# Patient Record
Sex: Male | Born: 1960 | Hispanic: No | Marital: Married | Smoking: Former smoker
Health system: Southern US, Community
[De-identification: ages and names within clinical notes are randomized; demographics above are authoritative.]

## PROBLEM LIST (undated history)

## (undated) DIAGNOSIS — C679 Malignant neoplasm of bladder, unspecified: Secondary | ICD-10-CM

## (undated) HISTORY — PX: BLADDER SURGERY: SHX569

---

## 1999-09-07 ENCOUNTER — Emergency Department (HOSPITAL_COMMUNITY): Admission: EM | Admit: 1999-09-07 | Discharge: 1999-09-07 | Payer: Self-pay | Admitting: Emergency Medicine

## 2004-02-06 ENCOUNTER — Emergency Department (HOSPITAL_COMMUNITY): Admission: EM | Admit: 2004-02-06 | Discharge: 2004-02-06 | Payer: Self-pay | Admitting: *Deleted

## 2009-12-13 ENCOUNTER — Emergency Department (HOSPITAL_COMMUNITY): Admission: EM | Admit: 2009-12-13 | Discharge: 2009-12-13 | Payer: Self-pay | Admitting: Emergency Medicine

## 2010-12-18 LAB — DIFFERENTIAL
Basophils Absolute: 0 10*3/uL (ref 0.0–0.1)
Eosinophils Absolute: 0.1 10*3/uL (ref 0.0–0.7)
Lymphocytes Relative: 27 % (ref 12–46)
Lymphs Abs: 2.1 10*3/uL (ref 0.7–4.0)
Monocytes Absolute: 0.6 10*3/uL (ref 0.1–1.0)
Neutro Abs: 5 10*3/uL (ref 1.7–7.7)

## 2010-12-18 LAB — POCT I-STAT, CHEM 8
BUN: 17 mg/dL (ref 6–23)
Calcium, Ion: 1.07 mmol/L — ABNORMAL LOW (ref 1.12–1.32)
Chloride: 108 mEq/L (ref 96–112)
Creatinine, Ser: 1.1 mg/dL (ref 0.4–1.5)
Glucose, Bld: 119 mg/dL — ABNORMAL HIGH (ref 70–99)
Hemoglobin: 17.7 g/dL — ABNORMAL HIGH (ref 13.0–17.0)
Potassium: 4.2 mEq/L (ref 3.5–5.1)
Sodium: 140 mEq/L (ref 135–145)
TCO2: 28 mmol/L (ref 0–100)

## 2010-12-18 LAB — POCT CARDIAC MARKERS

## 2010-12-18 LAB — CBC
HCT: 49.9 % (ref 39.0–52.0)
Hemoglobin: 17 g/dL (ref 13.0–17.0)
MCV: 85.2 fL (ref 78.0–100.0)
RDW: 13 % (ref 11.5–15.5)

## 2011-05-26 ENCOUNTER — Emergency Department (HOSPITAL_COMMUNITY)
Admission: EM | Admit: 2011-05-26 | Discharge: 2011-05-26 | Discharge status: Against Medical Advice | Payer: Self-pay | Attending: Emergency Medicine | Admitting: Emergency Medicine

## 2011-05-26 DIAGNOSIS — R3989 Other symptoms and signs involving the genitourinary system: Secondary | ICD-10-CM | POA: Insufficient documentation

## 2011-06-27 ENCOUNTER — Inpatient Hospital Stay (INDEPENDENT_AMBULATORY_CARE_PROVIDER_SITE_OTHER)
Admission: RE | Admit: 2011-06-27 | Discharge: 2011-06-27 | Disposition: A | Discharge status: Home / Self Care | Payer: Self-pay | Source: Ambulatory Visit | Attending: Emergency Medicine | Admitting: Emergency Medicine

## 2011-06-27 DIAGNOSIS — K120 Recurrent oral aphthae: Secondary | ICD-10-CM

## 2011-07-31 ENCOUNTER — Other Ambulatory Visit: Payer: Self-pay | Admitting: Internal Medicine

## 2011-08-02 ENCOUNTER — Ambulatory Visit
Admission: RE | Admit: 2011-08-02 | Discharge: 2011-08-02 | Disposition: A | Discharge status: Home / Self Care | Payer: Self-pay | Source: Ambulatory Visit | Attending: Internal Medicine | Admitting: Internal Medicine

## 2011-09-06 NOTE — Patient Instructions (Signed)
20 [name]  09/06/2011   Your procedure is scheduled on:  09/13/2011  Report to [facility] at  700  AM.  Call this number if you have problems the morning of surgery: [phone]   Remember:   Do not eat food:After Midnight.  May have clear liquids:until Midnight .  Clear liquids include soda, tea, black coffee, apple or grape juice, broth.  Take these medicines the morning of surgery with A SIP OF WATER: none   Do not wear jewelry, make-up or nail polish.  Do not wear lotions, powders, or perfumes. You may wear deodorant.  Do not shave 48 hours prior to surgery.  Do not bring valuables to the hospital.  Contacts, dentures or bridgework may not be worn into surgery.  Leave suitcase in the car. After surgery it may be brought to your room.  For patients admitted to the hospital, checkout time is 11:00 AM the day of discharge.   Patients discharged the day of surgery will not be allowed to drive home.  Name and phone number of your driver: family  Special Instructions: CHG Shower Use Special Wash: 1/2 bottle night before surgery and 1/2 bottle morning of surgery.   Please read over the following fact sheets that you were given: Pain Booklet, MRSA Information, Surgical Site Infection Prevention, Anesthesia Post-op Instructions and Care and Recovery After Surgery Transurethral Resection, Bladder Tumor A cancerous growth (tumor) can develop on the inside wall of the bladder. The bladder is the organ that holds urine. One way to remove the tumor is a procedure called a transurethral resection. The tumor is removed (resected) through the tube that carries urine from the bladder out of the body (urethra). No cuts (incisions) are made in the skin. Instead, the procedure is done through a thin telescope, called a resectoscope. Attached to it is a light and usually a tiny camera. The resectoscope is put into the urethra. In men, the urethra opens at the end of the penis. In women, it opens just  above the vagina.  A transurethral resection is usually used to remove tumors that have not gotten too big or too deep. These are called Stage 0, Stage 1 or Stage 2 bladder cancers. LET YOUR CAREGIVER KNOW ABOUT:  On the day of the procedure, your caregivers will need to know the last time you had anything to eat or drink. This includes water, gum, and candy. In advance, make sure they know about:   Any allergies.   All medications you are taking, including:   Herbs, eyedrops, over-the-counter medications and creams.   Blood thinners (anticoagulants), aspirin or other drugs that could affect blood clotting.   Use of steroids (by mouth or as creams).   Previous problems with anesthetics, including local anesthetics.   Possibility of pregnancy, if this applies.   Any history of blood clots.   Any history of bleeding or other blood problems.   Previous surgery.   Smoking history.   Any recent symptoms of colds or infections.   Other health problems.  RISKS AND COMPLICATIONS This is usually a safe procedure. Every procedure has risks, though. For a transurethral resection, they include:  Infection. Antibiotic medication would need to be taken.   Bleeding.   Light bleeding may last for several days after the procedure.   If bleeding continues or is heavy, the bladder may need rinsing. Or, a new catheter might be put in for awhile.   Sometimes bed rest is needed.  Urination problems.   Pain and burning can occur when urinating. This usually goes away in a few days.   Scarring from the procedure can block the flow of urine.   Bladder damage.   It can be punctured or torn during removal of the tumor. If this happens, a catheter might be needed for longer. Antibiotics would be taken while the bladder heals.   Urine can leak through the hole or tear into the abdomen. If this happens, surgery may be needed to repair the bladder.  BEFORE THE PROCEDURE   A medical  evaluation will be done. This may include:   A physical examination.   Urine test. This is to make sure you do not have a urinary tract infection.   Blood tests.   A test that checks the heart's rhythm (electrocardiogram).   Talking with an anesthesiologist. This is the person who will be in charge of the medication (anesthesia) to keep you from feeling pain during the transurethral resection. You might be asleep during the procedure (general anesthesia) or numb from the waist down, but awake during the procedure (spinal anesthesia). Ask your surgeon what to expect.   The person who is having a transurethral resection needs to give what is called informed consent. This requires signing a legal paper that gives permission for the procedure. To give informed consent:   You must understand how the procedure is done and why.   You must be told all the risks and benefits of the procedure.   You must sign the consent. Sometimes a legal guardian can do this.   Signing should be witnessed by a healthcare professional.   The day before the surgery, eat only a light dinner. Then, do not eat or drink anything for at least 8 hours before the surgery. Ask your caregiver if it is OK to take any needed medicines with a sip of water.   Arrive at least an hour before the surgery or whenever your surgeon recommends. This will give you time to check in and fill out any needed paperwork.  PROCEDURE  The preparation:   You will change into a hospital gown.   A needle will be inserted in your arm. This is an intravenous access tube (IV). Medication will be able to flow directly into your body through this needle.   Small monitors will be put on your body. They are used to check your heart, blood pressure, and oxygen level.   You might be given medication that will help you relax (sedative).   You will be given a general anesthetic or spinal anesthesia.   The procedure:   Once you are asleep or  numb from the waist down, your legs will be placed in stirrups.   The resectoscope will be passed through the urethra into the bladder.   Fluid will be passed through the resectoscope. This will fill the bladder with water.   The surgeon will examine the bladder through the scope. If the scope has a camera, it can take pictures from inside the bladder. They can be projected onto a TV screen.   The surgeon will use various tools to remove the tumor in small pieces. Sometimes a laser (a beam of light energy) is used. Other tools may use electric current.   A tube (catheter) will often be placed so that urine can drain into a bag outside the body. This process helps stop bleeding. This tube keeps blood clots from blocking the urethra.  The procedure usually takes 30 to 45 minutes.  AFTER THE PROCEDURE   You will stay in a recovery area until the anesthesia has worn off. Your blood pressure and pulse will be checked every so often. Then you will be taken to a hospital room.   You may continue to get fluids through the IV for awhile.   Some pain is normal. The catheter might be uncomfortable. Pain is usually not severe. If it is, ask for pain medicine.   Your urine may look bloody after a transurethral resection. This is normal.   If bleeding is heavy, a hospital caregiver may rinse out the bladder (irrigation) through the catheter.   Once the urine is clear, the catheter will be taken out.   You will need to stay in the hospital until you can urinate on your own.   Most people stay in the hospital for up to 4 days.  PROGNOSIS   Transurethral resection is considered the best way to treat bladder tumors that are not too far along. For most people, the treatment is successful. Sometimes, though, more treatment is needed.   Bladder cancers can come back even after a successful procedure. Because of this, be sure to have a checkup with your caregiver every 3 to 6 months. If everything is OK  for 3 years, you can reduce the checkups to once a year.  Document Released: 07/07/2009 Document Revised: 05/23/2011 Document Reviewed: 07/07/2009 [facility] Patient Information 2012 [place], [place].PATIENT INSTRUCTIONS POST-ANESTHESIA  IMMEDIATELY FOLLOWING SURGERY:  Do not drive or operate machinery for the first twenty four hours after surgery.  Do not make any important decisions for twenty four hours after surgery or while taking narcotic pain medications or sedatives.  If you develop intractable nausea and vomiting or a severe headache please notify your doctor immediately.  FOLLOW-UP:  Please make an appointment with your surgeon as instructed. You do not need to follow up with anesthesia unless specifically instructed to do so.  WOUND CARE INSTRUCTIONS (if applicable):  Keep a dry clean dressing on the anesthesia/puncture wound site if there is drainage.  Once the wound has quit draining you may leave it open to air.  Generally you should leave the bandage intact for twenty four hours unless there is drainage.  If the epidural site drains for more than 36-48 hours please call the anesthesia department.  QUESTIONS?:  Please feel free to call your physician or the hospital operator if you have any questions, and they will be happy to assist you.     [facility] Anesthesia Department [address] [place] [phone]

## 2011-09-07 ENCOUNTER — Other Ambulatory Visit: Payer: Self-pay

## 2011-09-07 ENCOUNTER — Encounter (HOSPITAL_COMMUNITY)
Admission: RE | Admit: 2011-09-07 | Discharge: 2011-09-07 | Disposition: A | Discharge status: Home / Self Care | Payer: Medicaid Other | Source: Ambulatory Visit | Attending: Urology | Admitting: Urology

## 2011-09-07 ENCOUNTER — Encounter (HOSPITAL_COMMUNITY): Payer: Self-pay | Admitting: Pharmacy Technician

## 2011-09-07 ENCOUNTER — Encounter (HOSPITAL_COMMUNITY): Payer: Self-pay

## 2011-09-07 LAB — SURGICAL PCR SCREEN
MRSA, PCR: NEGATIVE
Staphylococcus aureus: NEGATIVE

## 2011-09-07 LAB — BASIC METABOLIC PANEL
CO2: 29 mEq/L (ref 19–32)
Chloride: 100 mEq/L (ref 96–112)
GFR calc Af Amer: >90 mL/min (ref >90)
Glucose, Bld: 97 mg/dL (ref 70–99)

## 2011-09-07 LAB — CBC
HCT: 47.6 % (ref 39.0–52.0)
Hemoglobin: 16.1 g/dL (ref 13.0–17.0)
MCH: 28.1 pg (ref 26.0–34.0)
MCHC: 33.8 g/dL (ref 30.0–36.0)
MCV: 83.1 fL (ref 78.0–100.0)
RBC: 5.73 MIL/uL (ref 4.22–5.81)
RDW: 13.3 % (ref 11.5–15.5)
WBC: 5.6 10*3/uL (ref 4.0–10.5)

## 2011-09-13 ENCOUNTER — Ambulatory Visit (HOSPITAL_COMMUNITY): Payer: Medicaid Other | Admitting: Anesthesiology

## 2011-09-13 ENCOUNTER — Ambulatory Visit (HOSPITAL_COMMUNITY)
Admission: RE | Admit: 2011-09-13 | Discharge: 2011-09-14 | Disposition: A | Discharge status: Home / Self Care | Payer: Medicaid Other | Source: Ambulatory Visit | Attending: Urology | Admitting: Urology

## 2011-09-13 ENCOUNTER — Other Ambulatory Visit: Payer: Self-pay | Admitting: Urology

## 2011-09-13 ENCOUNTER — Encounter (HOSPITAL_COMMUNITY): Payer: Self-pay | Admitting: Anesthesiology

## 2011-09-13 ENCOUNTER — Encounter (HOSPITAL_COMMUNITY): Payer: Self-pay | Admitting: *Deleted

## 2011-09-13 ENCOUNTER — Encounter (HOSPITAL_COMMUNITY)
Admission: RE | Disposition: A | Discharge status: Home / Self Care | Payer: Self-pay | Source: Ambulatory Visit | Attending: Urology

## 2011-09-13 DIAGNOSIS — C679 Malignant neoplasm of bladder, unspecified: Secondary | ICD-10-CM | POA: Insufficient documentation

## 2011-09-13 DIAGNOSIS — R310 Gross hematuria: Secondary | ICD-10-CM | POA: Insufficient documentation

## 2011-09-13 HISTORY — PX: TRANSURETHRAL RESECTION OF BLADDER TUMOR: SHX2575

## 2011-09-13 SURGERY — TURBT (TRANSURETHRAL RESECTION OF BLADDER TUMOR)
Anesthesia: Spinal | Wound class: Clean Contaminated

## 2011-09-13 MED ORDER — MIDAZOLAM HCL 2 MG/2ML IJ SOLN
INTRAMUSCULAR | Status: AC
  Filled 2011-09-13: qty 2

## 2011-09-13 MED ORDER — LACTATED RINGERS IV SOLN
INTRAVENOUS | Status: DC
  Administered 2011-09-13: 1000 mL via INTRAVENOUS
  Administered 2011-09-13: 10:00:00 via INTRAVENOUS

## 2011-09-13 MED ORDER — PROPOFOL 10 MG/ML IV EMUL
INTRAVENOUS | Status: AC
  Filled 2011-09-13: qty 20

## 2011-09-13 MED ORDER — FENTANYL CITRATE 0.05 MG/ML IJ SOLN
INTRAMUSCULAR | Status: AC
  Filled 2011-09-13: qty 2

## 2011-09-13 MED ORDER — HYDROMORPHONE HCL PF 1 MG/ML IJ SOLN
1.0000 mg | INTRAMUSCULAR | Status: DC | PRN
  Administered 2011-09-13 – 2011-09-14 (×2): 1 mg via INTRAVENOUS
  Filled 2011-09-13 (×2): qty 1

## 2011-09-13 MED ORDER — ONDANSETRON HCL 4 MG/2ML IJ SOLN: 4.0000 mg | Freq: Once | INTRAMUSCULAR | Status: DC | PRN

## 2011-09-13 MED ORDER — MIDAZOLAM HCL 2 MG/2ML IJ SOLN
1.0000 mg | INTRAMUSCULAR | Status: DC | PRN
  Administered 2011-09-13 (×2): 2 mg via INTRAVENOUS

## 2011-09-13 MED ORDER — BUPIVACAINE HCL 0.75 % IJ SOLN
INTRAMUSCULAR | Status: DC | PRN
  Administered 2011-09-13: 13.5 mg via INTRATHECAL

## 2011-09-13 MED ORDER — MIDAZOLAM HCL 2 MG/2ML IJ SOLN
INTRAMUSCULAR | Status: AC
Start: 2011-09-13 — End: 2011-09-13
  Filled 2011-09-13: qty 2

## 2011-09-13 MED ORDER — FENTANYL CITRATE 0.05 MG/ML IJ SOLN: 25.0000 ug | INTRAMUSCULAR | Status: DC | PRN

## 2011-09-13 MED ORDER — PHENYLEPHRINE HCL 10 MG/ML IJ SOLN
INTRAMUSCULAR | Status: DC | PRN
  Administered 2011-09-13: 50 ug via INTRAVENOUS

## 2011-09-13 MED ORDER — MIDAZOLAM HCL 2 MG/2ML IJ SOLN
INTRAMUSCULAR | Status: AC
  Administered 2011-09-13: 2 mg via INTRAVENOUS
  Filled 2011-09-13: qty 2

## 2011-09-13 MED ORDER — GLYCINE 1.5 % IR SOLN
Status: DC | PRN
  Administered 2011-09-13: 3000 mL

## 2011-09-13 MED ORDER — PROPOFOL 10 MG/ML IV EMUL
INTRAVENOUS | Status: DC | PRN
  Administered 2011-09-13: 35 ug/kg/min via INTRAVENOUS

## 2011-09-13 MED ORDER — STERILE WATER FOR IRRIGATION IR SOLN
Status: DC | PRN
  Administered 2011-09-13 (×2): 3000 mL
  Administered 2011-09-13: 1000 mL
  Administered 2011-09-13: 3000 mL

## 2011-09-13 MED ORDER — MITOMYCIN CHEMO FOR BLADDER INSTILLATION 40 MG
60.0000 mg | Freq: Once | INTRAVENOUS | Status: DC
  Administered 2011-09-13: 60 mg via INTRAVESICAL
  Filled 2011-09-13: qty 60

## 2011-09-13 MED ORDER — BUPIVACAINE IN DEXTROSE 0.75-8.25 % IT SOLN
INTRATHECAL | Status: AC
  Filled 2011-09-13: qty 2

## 2011-09-13 MED ORDER — PHENYLEPHRINE HCL 10 MG/ML IJ SOLN
INTRAMUSCULAR | Status: AC
  Filled 2011-09-13: qty 1

## 2011-09-13 MED ORDER — MIDAZOLAM HCL 5 MG/5ML IJ SOLN
INTRAMUSCULAR | Status: DC | PRN
  Administered 2011-09-13: 2 mg via INTRAVENOUS

## 2011-09-13 MED ORDER — FENTANYL CITRATE 0.05 MG/ML IJ SOLN
INTRAMUSCULAR | Status: DC | PRN
  Administered 2011-09-13: 20 ug via INTRATHECAL
  Administered 2011-09-13: 50 ug via INTRAVENOUS

## 2011-09-13 SURGICAL SUPPLY — 31 items
BAG DECANTER FOR FLEXI CONT (MISCELLANEOUS) ×2 IMPLANT
BAG DRAIN URO TABLE W/ADPT NS (DRAPE) ×2 IMPLANT
BAG DRN 8 ADPR NS SKTRN CSTL (DRAPE) ×1
BAG URINE DRAINAGE (UROLOGICAL SUPPLIES) ×2 IMPLANT
CABLE HI FREQUENCY MONOPOLAR (ELECTROSURGICAL) ×2 IMPLANT
CATH FOLEY 2WAY SLVR  5CC 20FR (CATHETERS) ×1
CATH FOLEY 2WAY SLVR  5CC 22FR (CATHETERS) ×1
CATH FOLEY 2WAY SLVR 5CC 20FR (CATHETERS) ×1 IMPLANT
CATH FOLEY 2WAY SLVR 5CC 22FR (CATHETERS) IMPLANT
CLOTH BEACON ORANGE TIMEOUT ST (SAFETY) ×2 IMPLANT
CONNECTOR 5 IN 1 STRAIGHT STRL (MISCELLANEOUS) ×2 IMPLANT
ELECT CUT LOOP C-MAX 27FR .012 (CUTTING LOOP) ×2
ELECTRODE CUT LP CMX 27FR .012 (CUTTING LOOP) ×1 IMPLANT
FORMALIN 10 PREFIL 120ML (MISCELLANEOUS) IMPLANT
GLOVE BIO SURGEON STRL SZ7 (GLOVE) ×2 IMPLANT
GLOVE BIOGEL PI IND STRL 7.0 (GLOVE) IMPLANT
GLOVE BIOGEL PI INDICATOR 7.0 (GLOVE) ×2
GLOVE EXAM NITRILE MD LF STRL (GLOVE) ×1 IMPLANT
GLOVE OPTIFIT SS 6.5 STRL BRWN (GLOVE) ×1 IMPLANT
GLYCINE 1.5% IRRIG UROMATIC (IV SOLUTION) ×7 IMPLANT
GOWN STRL REIN XL XLG (GOWN DISPOSABLE) ×2 IMPLANT
IV NS IRRIG 3000ML ARTHROMATIC (IV SOLUTION) ×2 IMPLANT
KIT ROOM TURNOVER AP CYSTO (KITS) ×2 IMPLANT
MANIFOLD NEPTUNE II (INSTRUMENTS) ×2 IMPLANT
PACK CYSTO (CUSTOM PROCEDURE TRAY) ×2 IMPLANT
PAD ARMBOARD 7.5X6 YLW CONV (MISCELLANEOUS) ×2 IMPLANT
PAD TELFA 3X4 1S STER (GAUZE/BANDAGES/DRESSINGS) IMPLANT
SET IRRIGATING DISP (SET/KITS/TRAYS/PACK) ×2 IMPLANT
SYR 30ML LL (SYRINGE) ×2 IMPLANT
TOWEL OR 17X26 4PK STRL BLUE (TOWEL DISPOSABLE) ×2 IMPLANT
YANKAUER SUCT BULB TIP 10FT TU (MISCELLANEOUS) ×2 IMPLANT

## 2011-09-13 NOTE — Progress Notes (Signed)
Pt turned to back. Foley reconnected to drainage bag.

## 2011-09-13 NOTE — Anesthesia Procedure Notes (Signed)
Spinal  Patient location during procedure: OR Start time: 09/13/2011 9:11 AM Staffing CRNA/Resident: Glynn Octave Preanesthetic Checklist Completed: patient identified, site marked, surgical consent, pre-op evaluation, timeout performed, IV checked, risks and benefits discussed and monitors and equipment checked Spinal Block Patient position: sitting Prep: DuraPrep Patient monitoring: heart rate, cardiac monitor, continuous pulse ox and blood pressure Approach: midline Location: L4-5 Injection technique: single-shot Needle Needle type: Spinocan  Needle gauge: 22 G Needle length: 9 cm Assessment Sensory level: T8 Additional Notes Tray lot # [id number], Exp date 05/2012

## 2011-09-13 NOTE — Progress Notes (Signed)
Pt turned to right side.

## 2011-09-13 NOTE — Transfer of Care (Signed)
Immediate Anesthesia Transfer of Care Note  Patient: [name]  Procedure(s) Performed:  TRANSURETHRAL RESECTION OF BLADDER TUMOR (TURBT) - large bladder tumor  Patient Location: PACU  Anesthesia Type: Spinal  Level of Consciousness: awake, alert  and oriented  Airway & Oxygen Therapy: Patient Spontanous Breathing and Patient connected to nasal cannula oxygen  Post-op Assessment: Report given to PACU RN  Post vital signs: Reviewed  Complications: No apparent anesthesia complications

## 2011-09-13 NOTE — Brief Op Note (Signed)
09/13/2011  9:42 AM  PATIENT:  [name]  50 y.o. male  PRE-OPERATIVE DIAGNOSIS:  bladder cancer  POST-OPERATIVE DIAGNOSIS:  bladder cancer  PROCEDURE:  Procedure(s): TRANSURETHRAL RESECTION OF BLADDER TUMOR (TURBT)  SURGEON:  Surgeon(s): [name] Barban  PHYSICIAN ASSISTANT:   ASSISTANTS: none   ANESTHESIA:   general  EBL:  Total I/O In: 500 [I.V.:500] Out: 0   BLOOD ADMINISTERED:none  DRAINS: Urinary Catheter (Foley)   LOCAL MEDICATIONS USED:  NONE  SPECIMEN:  Scraping  DISPOSITION OF SPECIMEN:  PATHOLOGY  COUNTS:  YES  TOURNIQUET:  * No tourniquets in log *  DICTATION: .Other Dictation: Dictation Number [id number]  PLAN OF CARE: Admit for overnight observation  PATIENT DISPOSITION:  PACU - hemodynamically stable.   Delay start of Pharmacological VTE agent (>24hrs) due to surgical blood loss or risk of bleeding:  {YES

## 2011-09-13 NOTE — Progress Notes (Signed)
Turned pt to back.

## 2011-09-13 NOTE — Progress Notes (Addendum)
Turned pt to left side after mitomycin instillation. Foley clamped.

## 2011-09-13 NOTE — Plan of Care (Signed)
Problem: Phase I Progression Outcomes Goal: Initial discharge plan identified Outcome: Completed/Met Date Met:  09/13/11 Home with family

## 2011-09-13 NOTE — Anesthesia Preprocedure Evaluation (Addendum)
Anesthesia Evaluation  Patient identified by MRN, date of birth, ID band Patient awake    Reviewed: Allergy & Precautions, H&P , NPO status , Patient's Chart, lab work & pertinent test results  Airway Mallampati: I      Dental  (+) Teeth Intact   Pulmonary neg pulmonary ROS,  clear to auscultation        Cardiovascular neg cardio ROS Regular Normal    Neuro/Psych    GI/Hepatic   Endo/Other    Renal/GU      Musculoskeletal   Abdominal   Peds  Hematology   Anesthesia Other Findings   Reproductive/Obstetrics                           Anesthesia Physical Anesthesia Plan  ASA: II  Anesthesia Plan: Spinal   Post-op Pain Management:    Induction:   Airway Management Planned: Nasal Cannula  Additional Equipment:   Intra-op Plan:   Post-operative Plan:   Informed Consent: I have reviewed the patients History and Physical, chart, labs and discussed the procedure including the risks, benefits and alternatives for the proposed anesthesia with the patient or authorized representative who has indicated his/her understanding and acceptance.     Plan Discussed with:   Anesthesia Plan Comments:         Anesthesia Quick Evaluation

## 2011-09-13 NOTE — Anesthesia Postprocedure Evaluation (Addendum)
  Anesthesia Post-op Note  Patient: [name]  Procedure(s) Performed:  TRANSURETHRAL RESECTION OF BLADDER TUMOR (TURBT) - large bladder tumor  Patient Location: PACU  Anesthesia Type: Spinal  Level of Consciousness: awake, alert  and oriented  Airway and Oxygen Therapy: Patient Spontanous Breathing  Post-op Pain: none  Post-op Assessment: Post-op Vital signs reviewed, Patient's Cardiovascular Status Stable, Respiratory Function Stable and No signs of Nausea or vomiting  Post-op Vital Signs: Reviewed, SBP 74, neosynephrine 50 mcg IV  Complications: No apparent anesthesia complications   09/14/11  Patient doing well.  VSS.  Sensation returned to normal, denies headache and/or backache.  No apparent anesthesia complications.

## 2011-09-13 NOTE — Progress Notes (Signed)
Pt reexaminedand no change in h&p. 

## 2011-09-13 NOTE — H&P (Signed)
[name], [name]                ACCOUNT NO.:  [id number]  MEDICAL RECORD NO.:  [id number]  LOCATION:                                 FACILITY:  PHYSICIAN:  Ky Barban, M.D.DATE OF BIRTH:  June 09, 1961  DATE OF ADMISSION:  09/13/2011 DATE OF DISCHARGE:  LH                             HISTORY & PHYSICAL   CHIEF COMPLAINT:  Gross total painless hematuria.  HISTORY OF PRESENT ILLNESS:  This is a 49 year old gentleman who for the last couple of years is having gross total painless hematuria, has no voiding difficulty.  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  So, he is being brought as outpatient to undergo TUR bladder tumor under anesthesia and I will see if I can keep him overnight in the hospital.  He has also had a renal ultrasound done and that in the bladder they can see a irregular right posterior bladder wall lesion protrudes into the distended bladder.  Suspicious for tumor, so I confirmed with cystoscopy that it is a tumor.  PAST MEDICAL HISTORY:  No history of diabetes or hypertension.  PERSONAL HISTORY:  He used to smoke, quit smoking.  He used to smoke for 20 years, 1-pack per day.  Drinking, no.  REVIEW OF SYSTEMS:  Unremarkable.  PHYSICAL EXAMINATION:  VITAL SIGNS:  Blood pressure 120/80, temperature is normal. CENTRAL NERVOUS SYSTEM:  No gross neurological deficit. HEAD, NECK, EYE, ENT:  Negative. CHEST:  Symmetrical. HEART:  Regular sinus rhythm. ABDOMEN:  Soft and flat.  Liver, spleen, and kidneys are not palpable. No CVA tenderness. EXTERNAL GENITALIA:  Circumcised meatus is adequate.  Testicles are normal. RECTAL:  Deferred. EXTREMITIES:  Normal.  IMPRESSION:  Bladder tumor.  PLAN:  TUR bladder tumor under anesthesia, then keep him overnight in the hospital.     Ky Barban, M.D.     MIJ/MEDQ  D:  09/12/2011  T:  09/12/2011  Job:   [id number]  cc:   Beverely Risen, MD Fax: [phone]

## 2011-09-14 LAB — CBC
MCH: 27.6 pg (ref 26.0–34.0)
MCV: 86.1 fL (ref 78.0–100.0)
Platelets: 121 10*3/uL — ABNORMAL LOW (ref 150–400)
RDW: 13.8 % (ref 11.5–15.5)

## 2011-09-14 LAB — BASIC METABOLIC PANEL
BUN: 9 mg/dL (ref 6–23)
CO2: 31 mEq/L (ref 19–32)
Calcium: 9.4 mg/dL (ref 8.4–10.5)
Creatinine, Ser: 0.89 mg/dL (ref 0.50–1.35)
Glucose, Bld: 98 mg/dL (ref 70–99)

## 2011-09-14 MED ORDER — TOLTERODINE TARTRATE ER 4 MG PO CP24: 4.0000 mg | ORAL_CAPSULE | Freq: Every day | ORAL | Status: DC

## 2011-09-14 MED ORDER — MAGNESIUM HYDROXIDE 400 MG/5ML PO SUSP
30.0000 mL | Freq: Once | ORAL | Status: AC
  Administered 2011-09-14: 30 mL via ORAL
  Filled 2011-09-14: qty 30

## 2011-09-14 MED ORDER — TOLTERODINE TARTRATE 2 MG PO TABS
4.0000 mg | ORAL_TABLET | Freq: Once | ORAL | Status: AC
  Administered 2011-09-14: 4 mg via ORAL
  Filled 2011-09-14: qty 2

## 2011-09-14 MED ORDER — OXYCODONE-ACETAMINOPHEN 5-325 MG PO TABS: 1.0000 | ORAL_TABLET | Freq: Four times a day (QID) | ORAL | Status: AC | PRN

## 2011-09-14 MED ORDER — OXYCODONE-ACETAMINOPHEN 5-325 MG PO TABS
1.0000 | ORAL_TABLET | ORAL | Status: DC | PRN
  Administered 2011-09-14: 1 via ORAL
  Filled 2011-09-14: qty 1

## 2011-09-14 NOTE — Addendum Note (Signed)
Addendum  created 09/14/11 1610 by Glynn Octave   Modules edited:Notes Section

## 2011-09-14 NOTE — Progress Notes (Signed)
UR Chart Review Completed  

## 2011-09-14 NOTE — Op Note (Signed)
NAMEBRINDEN, KINCHELOE                ACCOUNT NO.:  [id number]  MEDICAL RECORD NO.:  [id number]  LOCATION:  A321                          FACILITY:  [facility]  PHYSICIAN:  Ky Barban, M.D.DATE OF BIRTH:  October 14, 1960  DATE OF PROCEDURE:  09/13/2011 DATE OF DISCHARGE:                              OPERATIVE REPORT   PREOPERATIVE DIAGNOSIS:  Bladder tumor.  POSTOPERATIVE DIAGNOSIS:  Bladder tumor.  PROCEDURE:  TUR, bladder tumor.  ANESTHESIA:  General.  PROCEDURE:  The patient under general endotracheal anesthesia in lithotomy position, after usual prep and drape, the Iglesias resectoscope was introduced into the bladder.  The tumor which is located on the right bladder wall, just a little bit posterior to the orifice is a papillary tumor and resection of the tumor from the surface was started and gradually went down toward the base of the tumor.  Tumor was completely resected off the bladder wall and the orifice was protected, not resected.  After resecting the tumor completely, the chips were evacuated.  Then, I went back and fulgurated the base of the tumor and the rest of the bladder was already examined.  There is no other focus of the tumor.  After making sure all the chips were out of the bladder, resectoscope was removed.  A 22 Foley catheter was left in for drainage.  There is no bleeding going on.  The patient left the operating room in satisfactory condition.     Ky Barban, M.D.     MIJ/MEDQ  D:  09/13/2011  T:  09/14/2011  Job:  [id number]  cc:   Beverely Risen, MD Fax: [phone]

## 2011-09-14 NOTE — Discharge Summary (Signed)
Report#[id number] 

## 2011-09-14 NOTE — Progress Notes (Signed)
PIV removed without complaint, patient discharged home. Patient verbalizes understanding of discharge instructions, prescriptions and follow up appointments. Patient escorted out by staff, transported by family. 

## 2011-09-15 NOTE — Consult Note (Signed)
[name], SPIVAK NO.:  [id number]  MEDICAL RECORD NO.:  [id number]  LOCATION:  A321                          FACILITY:  [facility]  PHYSICIAN:  Ky Barban, M.D.DATE OF BIRTH:  [birth year]  DATE OF CONSULTATION: DATE OF DISCHARGE:  09/14/2011                                DISCHARGE SUMMARY This gentleman who is 50 year old was having recurrent gross hematuria for several months with cystoscope in the office and was found to have papillary tumor in the bladder.  He was brought yesterday and under general anesthesia a TUR of bladder tumor was done.  Postop course was benign.  He remained afebrile.  Today, his urine is absolutely clear. His lab workup was as follows:  Sodium 138, potassium 4.2, chloride 102, CO2 is 31.  His CBC shows WBC count is 5.6, hematocrit is 47.6 and his urine is clear, so I am going to discharge him home.  He is up and walking around today and looks like he is having some bladder spasms, so I am going to go ahead and give him milk of magnesia, so he can have bowel movement and also give him of Detrol to treat his bladder spasms and he is going home with a Foley catheter.  I told him to go home, take it easy, on Monday he can take his Foley catheter out.  I showed him how to take it out and after that if he has significant bleeding to let me know and if he has any fever to let me know.  I am also going to give him Percocet 5 mg, #30 tablets, p.r.n. for pain and I am going to see him back in 2 weeks in the office.  His final pathology report is still pending.  I have told him that, but now he is being discharged.  FINAL DISCHARGE DIAGNOSIS:  Bladder tumor.  DISCHARGE CONDITION:  Improved.  DISCHARGE MEDICATION:  Percocet 5-325, #30, 1 q.6 h p.r.n.  He will get Detrol LA 4 mg 1 p.o. daily p.r.n., #10.  We will see him back in 2 weeks in the office.     Ky Barban, M.D.     MIJ/MEDQ  D:  09/14/2011  T:  09/15/2011  Job:   [id number]

## 2011-09-18 ENCOUNTER — Encounter (HOSPITAL_COMMUNITY): Payer: Self-pay | Admitting: Emergency Medicine

## 2011-09-18 ENCOUNTER — Emergency Department (HOSPITAL_COMMUNITY)
Admission: EM | Admit: 2011-09-18 | Discharge: 2011-09-18 | Disposition: A | Discharge status: Home / Self Care | Payer: Self-pay | Attending: Emergency Medicine | Admitting: Emergency Medicine

## 2011-09-18 DIAGNOSIS — R319 Hematuria, unspecified: Secondary | ICD-10-CM | POA: Insufficient documentation

## 2011-09-18 DIAGNOSIS — Z9889 Other specified postprocedural states: Secondary | ICD-10-CM | POA: Insufficient documentation

## 2011-09-18 DIAGNOSIS — Z8551 Personal history of malignant neoplasm of bladder: Secondary | ICD-10-CM | POA: Insufficient documentation

## 2011-09-18 DIAGNOSIS — Z79899 Other long term (current) drug therapy: Secondary | ICD-10-CM | POA: Insufficient documentation

## 2011-09-18 HISTORY — DX: Malignant neoplasm of bladder, unspecified: C67.9

## 2011-09-18 LAB — URINALYSIS, ROUTINE W REFLEX MICROSCOPIC
Bilirubin Urine: NEGATIVE
Ketones, ur: NEGATIVE mg/dL
Nitrite: NEGATIVE
Urobilinogen, UA: 0.2 mg/dL (ref 0.0–1.0)

## 2011-09-18 NOTE — ED Notes (Signed)
Patient stated he had surgery on his bladder on Thursday and had a foley catheter inserted. Patient was discharged on Friday with instructions to remove catheter on Monday. Patient removed catheter yesterday. Stated this morning approximately 2 hours ago, had fullness in abdomen and blood in urine when voiding. Was told in discharge instructions to return to hospital if bleeding noted in urine.

## 2011-09-18 NOTE — ED Provider Notes (Signed)
This chart was scribed for Shelda Jakes, MD by Wallis Mart. The patient was seen in room APA03/APA03 and the patient's care was started at 7:22 AM.   CSN: [id number]  Arrival date & time 09/18/11  [id number]   First MD Initiated Contact with Patient 09/18/11 [phone]      Chief Complaint  Patient presents with  . Hematuria    (Consider location/radiation/quality/duration/timing/severity/associated sxs/prior treatment) Patient is a 50 y.o. male presenting with hematuria. The history is provided by the patient.  Hematuria This is a new problem. The current episode started today. The problem has been gradually improving since onset. He reports no clotting in his urine stream. He is experiencing no pain. He describes his urine color as yellow. Pertinent negatives include no dysuria or fever.    Pt seen at 7:22 AM [name] is a 50 y.o. male who presents to the Emergency Department complaining of sudden onset, mild hematuria that began this morning.  Pt had bladder surgery on Thursday and removed his catheter as instructed yesterday and had no blood in urine at the time. Pt woke up at 4 AM this morning and saw blood in urine.  Pt drank water and urinated again and blood was still in urine.  Otherwise, pt is able to urinate with no problems. Pt denies pain in abdomen, but feels "fullness" in lower abdomen that began today.  Pt denies fever, headache, cp, breathing difficulty, back pain, swelling in legs, rash, congestion, visual changes, lightheadedness.  Pt has h/o bladder cancer.  Surgeon: Alleen Borne Past Medical History  Diagnosis Date  . Bladder cancer     Past Surgical History  Procedure Date  . Bladder surgery     Family History  Problem Relation Age of Onset  . Anesthesia problems Neg Hx   . Hypotension Neg Hx   . Malignant hyperthermia Neg Hx   . Pseudochol deficiency Neg Hx     History  Substance Use Topics  . Smoking status: Former Smoker -- 1.0 packs/day  for 20 years    Types: Cigarettes    Quit date: 09/07/2007  . Smokeless tobacco: Not on file  . Alcohol Use: No      Review of Systems  Constitutional: Negative for fever and fatigue.  HENT: Negative for congestion.   Respiratory: Negative for shortness of breath.   Cardiovascular: Negative for chest pain and leg swelling.  Genitourinary: Positive for hematuria. Negative for dysuria.  Musculoskeletal: Negative for back pain.  Neurological: Negative for light-headedness and headaches.   10 Systems reviewed and are negative for acute change except as noted in the HPI.   Allergies  Review of patient's allergies indicates no known allergies.  Home Medications   Current Outpatient Rx  Name Route Sig Dispense Refill  . OXYCODONE-ACETAMINOPHEN 5-325 MG PO TABS Oral Take 1 tablet by mouth every 6 (six) hours as needed. 30 tablet 0  . TOLTERODINE TARTRATE 4 MG PO CP24 Oral Take 1 capsule (4 mg total) by mouth daily. 10 capsule 0    BP 129/87  Pulse 74  Temp(Src) 97.7 F (36.5 C) (Oral)  Resp 16  Ht 6\' 2"  (1.88 m)  Wt 250 lb (113.399 kg)  BMI 32.10 kg/m2  SpO2 98%  Physical Exam  Nursing note and vitals reviewed. Constitutional: He is oriented to person, place, and time. He appears well-developed and well-nourished. No distress.  HENT:  Head: Normocephalic and atraumatic.  Eyes: EOM are normal. Pupils are equal, round, and reactive to  light.  Neck: Normal range of motion. Neck supple.  Cardiovascular: Normal rate, regular rhythm and normal heart sounds.   Pulmonary/Chest: Effort normal and breath sounds normal. No respiratory distress.  Abdominal: Soft. Bowel sounds are normal. He exhibits no distension.  Musculoskeletal: Normal range of motion. He exhibits no edema.  Lymphadenopathy:    He has no cervical adenopathy.  Neurological: He is alert and oriented to person, place, and time. No sensory deficit.  Skin: Skin is warm and dry.  Psychiatric: He has a normal mood  and affect. His behavior is normal.    ED Course  Procedures (including critical care time)  DIAGNOSTIC STUDIES: Oxygen Saturation is 98% on room air, normal by my interpretation.    COORDINATION OF CARE:     Results for orders placed during the hospital encounter of 09/18/11  URINALYSIS, ROUTINE W REFLEX MICROSCOPIC      Component Value Range   Color, Urine YELLOW  YELLOW    APPearance CLEAR  CLEAR    Specific Gravity, Urine 1.020  1.005 - 1.030    pH 6.0  5.0 - 8.0    Glucose, UA NEGATIVE  NEGATIVE (mg/dL)   Hgb urine dipstick LARGE (*) NEGATIVE    Bilirubin Urine NEGATIVE  NEGATIVE    Ketones, ur NEGATIVE  NEGATIVE (mg/dL)   Protein, ur NEGATIVE  NEGATIVE (mg/dL)   Urobilinogen, UA 0.2  0.0 - 1.0 (mg/dL)   Nitrite NEGATIVE  NEGATIVE    Leukocytes, UA NEGATIVE  NEGATIVE   URINE MICROSCOPIC-ADD ON      Component Value Range   RBC / HPF 11-20  <3 (RBC/hpf)   No results found.     1. Hematuria       MDM   Patient now voiding fine and had gross hematuria earlier that resolved here in the emergency department. UA still with microscopic hematuria. Discharge patient to hydrate well with the water today return for persistent gross blood in the urine or inability to void. He has followup with urology already scheduled.     I personally performed the services described in this documentation, which was scribed in my presence. The recorded information has been reviewed and considered.     Shelda Jakes, MD 09/18/11 [phone]

## 2011-09-21 ENCOUNTER — Encounter (HOSPITAL_COMMUNITY): Payer: Self-pay | Admitting: Urology

## 2011-10-09 NOTE — Discharge Summary (Signed)
[phone]

## 2011-10-10 NOTE — Consult Note (Signed)
NAMETAEVYN, HAUSEN NO.:  [id number]  MEDICAL RECORD NO.:  [id number]  LOCATION:                                 FACILITY:  PHYSICIAN:  Ky Barban, M.D.DATE OF BIRTH:  [birth year]  DATE OF CONSULTATION: DATE OF DISCHARGE:                                CONSULTATION   Mr. [name] is 51 year old gentleman who was having gross hematuria on and off for several months.  He was cystoscoped in the office.  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  After doing routine preadmission workup which is normal, CBC, BMET is normal.  EKG, chest x-ray is normal.  He was taken to the operating room and TUR bladder tumor was done.  Tumor was completely resected, and he was given a treatment of mitomycin in the bladder, irrigation in the recovery room as per our protocol, and postoperatively he did fine, next day his urine is clear and I planned to send him home with Foley catheter.  He is up and walking around.  FINAL DISCHARGE DIAGNOSES:  Bladder tumor, pathology report at that time was still pending and I will discuss with him further treatment when I get the pathology report.  Final discharge diagnosis is transitional cell carcinoma bladder.  DISCHARGE CONDITION:  Improved.  DISCHARGE MEDICATIONS:  None.  Report to the office in 1 day.  I will take his catheter out.     Ky Barban, M.D.     MIJ/MEDQ  D:  10/09/2011  T:  10/10/2011  Job:  [id number]

## 2012-01-07 ENCOUNTER — Encounter (HOSPITAL_COMMUNITY): Payer: Self-pay

## 2012-01-07 ENCOUNTER — Encounter (HOSPITAL_COMMUNITY)
Admission: RE | Admit: 2012-01-07 | Discharge: 2012-01-07 | Disposition: A | Discharge status: Home / Self Care | Payer: Medicaid Other | Source: Ambulatory Visit | Attending: Urology | Admitting: Urology

## 2012-01-07 LAB — DIFFERENTIAL
Basophils Relative: 1 % (ref 0–1)
Eosinophils Absolute: 0.1 10*3/uL (ref 0.0–0.7)
Monocytes Absolute: 0.5 10*3/uL (ref 0.1–1.0)
Monocytes Relative: 7 % (ref 3–12)

## 2012-01-07 LAB — BASIC METABOLIC PANEL
BUN: 8 mg/dL (ref 6–23)
Chloride: 100 mEq/L (ref 96–112)
Creatinine, Ser: 0.9 mg/dL (ref 0.50–1.35)
GFR calc Af Amer: >90 mL/min (ref >90)
GFR calc non Af Amer: >90 mL/min (ref >90)

## 2012-01-07 LAB — CBC
MCH: 28.1 pg (ref 26.0–34.0)
MCV: 82.6 fL (ref 78.0–100.0)
Platelets: 168 10*3/uL (ref 150–400)
RBC: 5.69 MIL/uL (ref 4.22–5.81)

## 2012-01-07 NOTE — Patient Instructions (Signed)
20 [name]  01/07/2012   Your procedure is scheduled on:  THursday, 01/10/12  Report to [facility] at 0840 AM.  Call this number if you have problems the morning of surgery: [phone]   Remember:   Do not eat food:After Midnight.  May have clear liquids:until Midnight .  Clear liquids include soda, tea, black coffee, apple or grape juice, broth.  Take these medicines the morning of surgery with A SIP OF WATER: none   Do not wear jewelry, make-up or nail polish.  Do not wear lotions, powders, or perfumes. You may wear deodorant.  Do not shave 48 hours prior to surgery.  Do not bring valuables to the hospital.  Contacts, dentures or bridgework may not be worn into surgery.  Leave suitcase in the car. After surgery it may be brought to your room.  For patients admitted to the hospital, checkout time is 11:00 AM the day of discharge.   Patients discharged the day of surgery will not be allowed to drive home.  Name and phone number of your driver: driver  Special Instructions: CHG Shower Use Special Wash: 1/2 bottle night before surgery and 1/2 bottle morning of surgery.   Please read over the following fact sheets that you were given: Pain Booklet, Coughing and Deep Breathing, MRSA Information, Surgical Site Infection Prevention, Anesthesia Post-op Instructions and Care and Recovery After Surgery   PATIENT INSTRUCTIONS POST-ANESTHESIA  IMMEDIATELY FOLLOWING SURGERY:  Do not drive or operate machinery for the first twenty four hours after surgery.  Do not make any important decisions for twenty four hours after surgery or while taking narcotic pain medications or sedatives.  If you develop intractable nausea and vomiting or a severe headache please notify your doctor immediately.  FOLLOW-UP:  Please make an appointment with your surgeon as instructed. You do not need to follow up with anesthesia unless specifically instructed to do so.  WOUND CARE INSTRUCTIONS (if applicable):  Keep  a dry clean dressing on the anesthesia/puncture wound site if there is drainage.  Once the wound has quit draining you may leave it open to air.  Generally you should leave the bandage intact for twenty four hours unless there is drainage.  If the epidural site drains for more than 36-48 hours please call the anesthesia department.  QUESTIONS?:  Please feel free to call your physician or the hospital operator if you have any questions, and they will be happy to assist you.         Transurethral Resection, Bladder Tumor A cancerous growth (tumor) can develop on the inside wall of the bladder. The bladder is the organ that holds urine. One way to remove the tumor is a procedure called a transurethral resection. The tumor is removed (resected) through the tube that carries urine from the bladder out of the body (urethra). No cuts (incisions) are made in the skin. Instead, the procedure is done through a thin telescope, called a resectoscope. Attached to it is a light and usually a tiny camera. The resectoscope is put into the urethra. In men, the urethra opens at the end of the penis. In women, it opens just above the vagina.  A transurethral resection is usually used to remove tumors that have not gotten too big or too deep. These are called Stage 0, Stage 1 or Stage 2 bladder cancers. LET YOUR CAREGIVER KNOW ABOUT:  On the day of the procedure, your caregivers will need to know the last time you had anything  to eat or drink. This includes water, gum, and candy. In advance, make sure they know about:   Any allergies.   All medications you are taking, including:   Herbs, eyedrops, over-the-counter medications and creams.   Blood thinners (anticoagulants), aspirin or other drugs that could affect blood clotting.   Use of steroids (by mouth or as creams).   Previous problems with anesthetics, including local anesthetics.   Possibility of pregnancy, if this applies.   Any history of blood  clots.   Any history of bleeding or other blood problems.   Previous surgery.   Smoking history.   Any recent symptoms of colds or infections.   Other health problems.  RISKS AND COMPLICATIONS This is usually a safe procedure. Every procedure has risks, though. For a transurethral resection, they include:  Infection. Antibiotic medication would need to be taken.   Bleeding.   Light bleeding may last for several days after the procedure.   If bleeding continues or is heavy, the bladder may need rinsing. Or, a new catheter might be put in for awhile.   Sometimes bed rest is needed.   Urination problems.   Pain and burning can occur when urinating. This usually goes away in a few days.   Scarring from the procedure can block the flow of urine.   Bladder damage.   It can be punctured or torn during removal of the tumor. If this happens, a catheter might be needed for longer. Antibiotics would be taken while the bladder heals.   Urine can leak through the hole or tear into the abdomen. If this happens, surgery may be needed to repair the bladder.  BEFORE THE PROCEDURE   A medical evaluation will be done. This may include:   A physical examination.   Urine test. This is to make sure you do not have a urinary tract infection.   Blood tests.   A test that checks the heart's rhythm (electrocardiogram).   Talking with an anesthesiologist. This is the person who will be in charge of the medication (anesthesia) to keep you from feeling pain during the transurethral resection. You might be asleep during the procedure (general anesthesia) or numb from the waist down, but awake during the procedure (spinal anesthesia). Ask your surgeon what to expect.   The person who is having a transurethral resection needs to give what is called informed consent. This requires signing a legal paper that gives permission for the procedure. To give informed consent:   You must understand how the  procedure is done and why.   You must be told all the risks and benefits of the procedure.   You must sign the consent. Sometimes a legal guardian can do this.   Signing should be witnessed by a healthcare professional.   The day before the surgery, eat only a light dinner. Then, do not eat or drink anything for at least 8 hours before the surgery. Ask your caregiver if it is OK to take any needed medicines with a sip of water.   Arrive at least an hour before the surgery or whenever your surgeon recommends. This will give you time to check in and fill out any needed paperwork.  PROCEDURE  The preparation:   You will change into a hospital gown.   A needle will be inserted in your arm. This is an intravenous access tube (IV). Medication will be able to flow directly into your body through this needle.   Small monitors will  be put on your body. They are used to check your heart, blood pressure, and oxygen level.   You might be given medication that will help you relax (sedative).   You will be given a general anesthetic or spinal anesthesia.   The procedure:   Once you are asleep or numb from the waist down, your legs will be placed in stirrups.   The resectoscope will be passed through the urethra into the bladder.   Fluid will be passed through the resectoscope. This will fill the bladder with water.   The surgeon will examine the bladder through the scope. If the scope has a camera, it can take pictures from inside the bladder. They can be projected onto a TV screen.   The surgeon will use various tools to remove the tumor in small pieces. Sometimes a laser (a beam of light energy) is used. Other tools may use electric current.   A tube (catheter) will often be placed so that urine can drain into a bag outside the body. This process helps stop bleeding. This tube keeps blood clots from blocking the urethra.   The procedure usually takes 30 to 45 minutes.  AFTER THE PROCEDURE    You will stay in a recovery area until the anesthesia has worn off. Your blood pressure and pulse will be checked every so often. Then you will be taken to a hospital room.   You may continue to get fluids through the IV for awhile.   Some pain is normal. The catheter might be uncomfortable. Pain is usually not severe. If it is, ask for pain medicine.   Your urine may look bloody after a transurethral resection. This is normal.   If bleeding is heavy, a hospital caregiver may rinse out the bladder (irrigation) through the catheter.   Once the urine is clear, the catheter will be taken out.   You will need to stay in the hospital until you can urinate on your own.   Most people stay in the hospital for up to 4 days.  PROGNOSIS   Transurethral resection is considered the best way to treat bladder tumors that are not too far along. For most people, the treatment is successful. Sometimes, though, more treatment is needed.   Bladder cancers can come back even after a successful procedure. Because of this, be sure to have a checkup with your caregiver every 3 to 6 months. If everything is OK for 3 years, you can reduce the checkups to once a year.  Document Released: 07/07/2009 Document Revised: 08/30/2011 Document Reviewed: 07/07/2009 [facility] Patient Information 2012 [place], [place].

## 2012-01-10 ENCOUNTER — Ambulatory Visit (HOSPITAL_COMMUNITY): Payer: Medicaid Other | Admitting: Anesthesiology

## 2012-01-10 ENCOUNTER — Encounter (HOSPITAL_COMMUNITY): Payer: Self-pay | Admitting: Anesthesiology

## 2012-01-10 ENCOUNTER — Encounter (HOSPITAL_COMMUNITY): Payer: Self-pay | Admitting: *Deleted

## 2012-01-10 ENCOUNTER — Ambulatory Visit (HOSPITAL_COMMUNITY)
Admission: RE | Admit: 2012-01-10 | Discharge: 2012-01-11 | Disposition: A | Discharge status: Home / Self Care | Payer: Medicaid Other | Source: Ambulatory Visit | Attending: Urology | Admitting: Urology

## 2012-01-10 ENCOUNTER — Encounter (HOSPITAL_COMMUNITY)
Admission: RE | Disposition: A | Discharge status: Home / Self Care | Payer: Self-pay | Source: Ambulatory Visit | Attending: Urology

## 2012-01-10 DIAGNOSIS — D494 Neoplasm of unspecified behavior of bladder: Secondary | ICD-10-CM | POA: Insufficient documentation

## 2012-01-10 DIAGNOSIS — Z8551 Personal history of malignant neoplasm of bladder: Secondary | ICD-10-CM | POA: Insufficient documentation

## 2012-01-10 HISTORY — PX: TRANSURETHRAL RESECTION OF BLADDER TUMOR: SHX2575

## 2012-01-10 HISTORY — PX: CYSTOSCOPY: SHX5120

## 2012-01-10 SURGERY — CYSTOSCOPY
Anesthesia: General | Site: Bladder | Wound class: Clean Contaminated

## 2012-01-10 MED ORDER — LACTATED RINGERS IV SOLN
INTRAVENOUS | Status: DC | PRN
  Administered 2012-01-10 (×2): via INTRAVENOUS

## 2012-01-10 MED ORDER — EPHEDRINE SULFATE 50 MG/ML IJ SOLN
INTRAMUSCULAR | Status: DC | PRN
  Administered 2012-01-10 (×2): 5 mg via INTRAVENOUS

## 2012-01-10 MED ORDER — MITOMYCIN CHEMO FOR BLADDER INSTILLATION 40 MG
60.0000 mg | Freq: Once | INTRAVENOUS | Status: DC
  Administered 2012-01-10: 60 mg via INTRAVESICAL
  Filled 2012-01-10: qty 60

## 2012-01-10 MED ORDER — ONDANSETRON HCL 4 MG/2ML IJ SOLN: 4.0000 mg | Freq: Once | INTRAMUSCULAR | Status: DC | PRN

## 2012-01-10 MED ORDER — IBUPROFEN 800 MG PO TABS: 400.0000 mg | ORAL_TABLET | Freq: Four times a day (QID) | ORAL | Status: DC | PRN

## 2012-01-10 MED ORDER — MITOMYCIN CHEMO FOR BLADDER INSTILLATION 40 MG: 60.0000 mg | Freq: Once | INTRAVENOUS | Status: DC

## 2012-01-10 MED ORDER — MORPHINE SULFATE 2 MG/ML IJ SOLN: 2.0000 mg | INTRAMUSCULAR | Status: DC | PRN

## 2012-01-10 MED ORDER — DEXTROSE-NACL 5-0.45 % IV SOLN
INTRAVENOUS | Status: DC
  Administered 2012-01-10 (×2): via INTRAVENOUS

## 2012-01-10 MED ORDER — ONDANSETRON HCL 4 MG/2ML IJ SOLN
INTRAMUSCULAR | Status: AC
  Filled 2012-01-10: qty 2

## 2012-01-10 MED ORDER — GLYCINE 1.5 % IR SOLN
Status: DC | PRN
  Administered 2012-01-10 (×2): 3000 mL

## 2012-01-10 MED ORDER — MIDAZOLAM HCL 2 MG/2ML IJ SOLN
1.0000 mg | INTRAMUSCULAR | Status: DC | PRN
  Administered 2012-01-10: 2 mg via INTRAVENOUS

## 2012-01-10 MED ORDER — FENTANYL CITRATE 0.05 MG/ML IJ SOLN
INTRAMUSCULAR | Status: AC
  Filled 2012-01-10: qty 2

## 2012-01-10 MED ORDER — FENTANYL CITRATE 0.05 MG/ML IJ SOLN
INTRAMUSCULAR | Status: AC
  Administered 2012-01-10: 50 ug via INTRAVENOUS
  Filled 2012-01-10: qty 2

## 2012-01-10 MED ORDER — FENTANYL CITRATE 0.05 MG/ML IJ SOLN
25.0000 ug | INTRAMUSCULAR | Status: DC | PRN
  Administered 2012-01-10: 50 ug via INTRAVENOUS

## 2012-01-10 MED ORDER — FENTANYL CITRATE 0.05 MG/ML IJ SOLN
INTRAMUSCULAR | Status: DC | PRN
  Administered 2012-01-10 (×4): 50 ug via INTRAVENOUS

## 2012-01-10 MED ORDER — PROPOFOL 10 MG/ML IV EMUL
INTRAVENOUS | Status: AC
  Filled 2012-01-10: qty 20

## 2012-01-10 MED ORDER — PROPOFOL 10 MG/ML IV EMUL
INTRAVENOUS | Status: DC | PRN
  Administered 2012-01-10 (×3): 200 mg via INTRAVENOUS

## 2012-01-10 MED ORDER — MIDAZOLAM HCL 2 MG/2ML IJ SOLN
INTRAMUSCULAR | Status: AC
  Administered 2012-01-10: 2 mg via INTRAVENOUS
  Filled 2012-01-10: qty 2

## 2012-01-10 MED ORDER — LACTATED RINGERS IV SOLN
INTRAVENOUS | Status: DC
  Administered 2012-01-10: 1000 mL via INTRAVENOUS

## 2012-01-10 MED ORDER — SODIUM CHLORIDE 0.9 % IR SOLN
Status: DC | PRN
  Administered 2012-01-10: 1000 mL

## 2012-01-10 MED ORDER — STERILE WATER FOR IRRIGATION IR SOLN
Status: DC | PRN
  Administered 2012-01-10: 1000 mL

## 2012-01-10 MED ORDER — GLYCINE 1.5 % IR SOLN
Status: DC | PRN
  Administered 2012-01-10: 3000 mL

## 2012-01-10 MED ORDER — ONDANSETRON HCL 4 MG/2ML IJ SOLN
INTRAMUSCULAR | Status: DC | PRN
  Administered 2012-01-10: 4 mg via INTRAVENOUS

## 2012-01-10 MED ORDER — SUCCINYLCHOLINE CHLORIDE 20 MG/ML IJ SOLN
INTRAMUSCULAR | Status: DC | PRN
  Administered 2012-01-10: 120 mg via INTRAVENOUS

## 2012-01-10 SURGICAL SUPPLY — 32 items
BAG DECANTER FOR FLEXI CONT (MISCELLANEOUS) ×2 IMPLANT
BAG DRAIN URO TABLE W/ADPT NS (DRAPE) ×2 IMPLANT
BAG DRN 8 ADPR NS SKTRN CSTL (DRAPE) ×1
BAG HAMPER (MISCELLANEOUS) ×2 IMPLANT
BAG URINE DRAINAGE (UROLOGICAL SUPPLIES) ×2 IMPLANT
CABLE HI FREQUENCY MONOPOLAR (ELECTROSURGICAL) ×2 IMPLANT
CATH FOLEY 2WAY SLVR  5CC 18FR (CATHETERS) ×1
CATH FOLEY 2WAY SLVR  5CC 20FR (CATHETERS)
CATH FOLEY 2WAY SLVR 5CC 18FR (CATHETERS) IMPLANT
CATH FOLEY 2WAY SLVR 5CC 20FR (CATHETERS) ×1 IMPLANT
CLOTH BEACON ORANGE TIMEOUT ST (SAFETY) ×2 IMPLANT
CONNECTOR 5 IN 1 STRAIGHT STRL (MISCELLANEOUS) ×2 IMPLANT
ELECT CUT LOOP C-MAX 27FR .012 (CUTTING LOOP) ×2
ELECTRODE CUT LP CMX 27FR .012 (CUTTING LOOP) ×1 IMPLANT
FORMALIN 10 PREFIL 120ML (MISCELLANEOUS) ×2 IMPLANT
GLOVE BIO SURGEON STRL SZ7 (GLOVE) ×2 IMPLANT
GLOVE ECLIPSE 6.5 STRL STRAW (GLOVE) ×2 IMPLANT
GLOVE INDICATOR 7.0 STRL GRN (GLOVE) ×4 IMPLANT
GLYCINE 1.5% IRRIG UROMATIC (IV SOLUTION) ×6 IMPLANT
GOWN STRL REIN XL XLG (GOWN DISPOSABLE) ×3 IMPLANT
IV NS IRRIG 3000ML ARTHROMATIC (IV SOLUTION) ×1 IMPLANT
KIT ROOM TURNOVER AP CYSTO (KITS) ×2 IMPLANT
MANIFOLD NEPTUNE II (INSTRUMENTS) ×2 IMPLANT
PACK CYSTO (CUSTOM PROCEDURE TRAY) ×2 IMPLANT
PAD ARMBOARD 7.5X6 YLW CONV (MISCELLANEOUS) ×2 IMPLANT
PAD TELFA 3X4 1S STER (GAUZE/BANDAGES/DRESSINGS) ×1 IMPLANT
SET IRRIGATING DISP (SET/KITS/TRAYS/PACK) ×2 IMPLANT
SYR 30ML LL (SYRINGE) ×2 IMPLANT
SYRINGE IRR TOOMEY STRL 70CC (SYRINGE) IMPLANT
TOWEL OR 17X26 4PK STRL BLUE (TOWEL DISPOSABLE) ×2 IMPLANT
WATER STERILE IRR 1000ML POUR (IV SOLUTION) ×2 IMPLANT
YANKAUER SUCT BULB TIP 10FT TU (MISCELLANEOUS) ×2 IMPLANT

## 2012-01-10 NOTE — Anesthesia Preprocedure Evaluation (Signed)
Anesthesia Evaluation  Patient identified by MRN, date of birth, ID band Patient awake    Reviewed: Allergy & Precautions, H&P , NPO status , Patient's Chart, lab work & pertinent test results  History of Anesthesia Complications Negative for: history of anesthetic complications  Airway Mallampati: I      Dental  (+) Teeth Intact   Pulmonary neg pulmonary ROS,  breath sounds clear to auscultation        Cardiovascular negative cardio ROS  Rhythm:Regular Rate:Normal     Neuro/Psych    GI/Hepatic   Endo/Other    Renal/GU      Musculoskeletal   Abdominal   Peds  Hematology   Anesthesia Other Findings   Reproductive/Obstetrics                           Anesthesia Physical Anesthesia Plan  ASA: I  Anesthesia Plan: General   Post-op Pain Management:    Induction: Intravenous  Airway Management Planned: LMA  Additional Equipment:   Intra-op Plan:   Post-operative Plan: Extubation in OR  Informed Consent: I have reviewed the patients History and Physical, chart, labs and discussed the procedure including the risks, benefits and alternatives for the proposed anesthesia with the patient or authorized representative who has indicated his/her understanding and acceptance.     Plan Discussed with:   Anesthesia Plan Comments:         Anesthesia Quick Evaluation  

## 2012-01-10 NOTE — Preoperative (Signed)
Beta Blockers   Reason not to administer Beta Blockers:Not Applicable 

## 2012-01-10 NOTE — Anesthesia Postprocedure Evaluation (Signed)
  Anesthesia Post-op Note  Patient: [name]  Procedure(s) Performed: Procedure(s) (LRB): CYSTOSCOPY (N/A) TRANSURETHRAL RESECTION OF BLADDER TUMOR (TURBT) (N/A)  Patient Location: PACU  Anesthesia Type: General  Level of Consciousness: awake, alert  and oriented  Airway and Oxygen Therapy: Patient Spontanous Breathing  Post-op Pain: none  Post-op Assessment: Post-op Vital signs reviewed, Patient's Cardiovascular Status Stable, Respiratory Function Stable, Patent Airway, No signs of Nausea or vomiting and Pain level controlled  Post-op Vital Signs: Reviewed and stable  Complications: No apparent anesthesia complications

## 2012-01-10 NOTE — Progress Notes (Signed)
No change in H&P on reexamination. 

## 2012-01-10 NOTE — Consult Note (Signed)
[name], [name]                ACCOUNT NO.:  [id number]  MEDICAL RECORD NO.:  [id number]  LOCATION:                                FACILITY:  [facility]  PHYSICIAN:  Ky Barban, M.D.DATE OF BIRTH:  04/11/1962  DATE OF CONSULTATION:  01/09/2012 DATE OF DISCHARGE:                                CONSULTATION   CHIEF COMPLAINT:  Questionable recurrent bladder tumor.  HISTORY OF PRESENT ILLNESS:  A 51 year old gentleman underwent TUR bladder tumor in December, 2012, for a low-grade transitional cell carcinoma.  He is clinically doing well.  Followup cystoscopy was done couple of weeks ago.  It shows there is one area, which appears to be questionable recurrence at the site of the tumor where I resected the tumor before and I am not sure, so I told them I need to cauterize and resect that area to check it what it is.  He is clinically doing well. Having no more hematuria, fever, chills, or any voiding difficulty.  PAST MEDICAL HISTORY:  TUR bladder tumor, December, 2012, for low-grade transition cell carcinoma.  No history of diabetes or hypertension.  SOCIAL HISTORY:  He does not smoke or drink.  REVIEW OF SYSTEMS:  Unremarkable.  PHYSICAL EXAMINATION:  VITAL SIGNS:  Blood pressure 120/80, fully conscious, alert, not in acute distress. CENTRAL NERVOUS SYSTEM:  No gross neurological deficit. HEAD, NECK, EYE, ENT:  Negative. CHEST:  Symmetrical. HEART:  Regular sinus rhythm.  No murmur. ABDOMEN:  Soft, flat.  Liver, spleen, kidneys are not palpable.  No CVA tenderness. EXTERNAL GENITALIA: Circumcised. Meatus adequate.  Testicles are normal. RECTAL: Deferred. EXTREMITIES: Normal.  IMPRESSION:  Questionable recurrent bladder tumor.  PLAN:  TUR bladder tumor under anesthesia and then keep him for observation overnight.     Ky Barban, M.D.     MIJ/MEDQ  D:  01/09/2012  T:  01/09/2012  Job:  [id number]

## 2012-01-10 NOTE — Transfer of Care (Signed)
Immediate Anesthesia Transfer of Care Note  Patient: [name]  Procedure(s) Performed: Procedure(s) (LRB): CYSTOSCOPY (N/A) TRANSURETHRAL RESECTION OF BLADDER TUMOR (TURBT) (N/A)  Patient Location: PACU  Anesthesia Type: General  Level of Consciousness: awake, alert  and oriented  Airway & Oxygen Therapy: Patient Spontanous Breathing and Patient connected to face mask oxygen  Post-op Assessment: Report given to PACU RN and Post -op Vital signs reviewed and stable  Post vital signs: Reviewed and stable  Complications: No apparent anesthesia complications

## 2012-01-11 LAB — CBC
MCV: 84.9 fL (ref 78.0–100.0)
Platelets: 128 10*3/uL — ABNORMAL LOW (ref 150–400)
RBC: 5.38 MIL/uL (ref 4.22–5.81)
WBC: 5.6 10*3/uL (ref 4.0–10.5)

## 2012-01-11 LAB — BASIC METABOLIC PANEL
CO2: 30 mEq/L (ref 19–32)
Chloride: 103 mEq/L (ref 96–112)
Creatinine, Ser: 0.92 mg/dL (ref 0.50–1.35)
Sodium: 139 mEq/L (ref 135–145)

## 2012-01-11 NOTE — Anesthesia Postprocedure Evaluation (Signed)
Anesthesia Post Note  Patient: [name]  Procedure(s) Performed: Procedure(s) (LRB): CYSTOSCOPY (N/A) TRANSURETHRAL RESECTION OF BLADDER TUMOR (TURBT) (N/A)  Anesthesia type: General  Patient location: 339  Post pain: Pain level controlled  Post assessment: Post-op Vital signs reviewed, Patient's Cardiovascular Status Stable, Respiratory Function Stable, Patent Airway, No signs of Nausea or vomiting and Pain level controlled  Last Vitals:  Filed Vitals:   01/11/12 0535  BP: 131/83  Pulse: 72  Temp: 36.9 C  Resp: 20    Post vital signs: Reviewed and stable  Level of consciousness: awake and alert   Complications: No apparent anesthesia complications

## 2012-01-11 NOTE — Progress Notes (Signed)
Report#[id number]

## 2012-01-11 NOTE — Op Note (Signed)
NAMETATSUYA, OKRAY NO.:  [id number]  MEDICAL RECORD NO.:  [id number]  LOCATION:  A339                          FACILITY:  [facility]  PHYSICIAN:  Ky Barban, M.D.DATE OF BIRTH:  [birth year]  DATE OF PROCEDURE: DATE OF DISCHARGE:                              OPERATIVE REPORT   SURGEON:  Ky Barban, MD  PREOPERATIVE DIAGNOSIS:  Recurrent bladder tumor.  POSTOPERATIVE DIAGNOSIS:  Recurrent bladder tumor.  PROCEDURE:  TUR of bladder tumor, small.  ANESTHESIA:  General.  PROCEDURE:  The patient under general endotracheal anesthesia, #28 Iglesias resectoscope was introduced.  The tumor site was inspected.  It looks like there were some calcium deposit at the site of previous resected site of the tumor, but I want to resect this area.  Biopsy did so that you can make sure there is no recurrent tumor.  Once I inspected this area proceeded to resect this area with 1 bite, I was able to remove this entire affected area, and it is right over the obturator reflex.  I am getting up the obturator reflex, so carefully I resected this area.  Below the mucosa, I can see the muscle looks clean.  Then, using a cold biopsy forceps, couple of biopsies were taken from the margins of this resected area and using the resectoscope again, I fulgurated the margins.  There is no bleeding.  The resectoscope was removed.  I did a digital rectal exam, prostate is 2+, smooth and firm. No rectal mass.  Foley catheter #18 was inserted.  The patient left the operating room in satisfactory condition.     Ky Barban, M.D.     MIJ/MEDQ  D:  01/10/2012  T:  01/11/2012  Job:  [id number]

## 2012-01-11 NOTE — Addendum Note (Signed)
Addendum  created 01/11/12 0748 by Franco Nones, CRNA   Modules edited:Notes Section

## 2012-01-14 NOTE — Progress Notes (Signed)
[name], [name]                ACCOUNT NO.:  [id number]  MEDICAL RECORD NO.:  [id number]  LOCATION:                                 FACILITY:  PHYSICIAN:  Ky Barban, M.D.DATE OF BIRTH:  Sep 23, 1961  DATE OF PROCEDURE:  01/11/2012 DATE OF DISCHARGE:                                PROGRESS NOTE   Mr. Wandel is doing well.  He is afebrile.  His catheter was taken out this morning.  He is voiding clear urine.  We will discharge him home. We will see him back in the office 2 weeks.  I have told him if he starts having bleeding to let me know, and if he has a fever to let me know.  I do not have to give him anything.  He is asking me to give him something to move his bowel, so we will put him on Colace 100 mg twice a day #60 and report to the office in 2 weeks.     Ky Barban, M.D.     MIJ/MEDQ  D:  01/11/2012  T:  01/12/2012  Job:  [id number]

## 2012-01-15 ENCOUNTER — Encounter (HOSPITAL_COMMUNITY): Payer: Self-pay | Admitting: Urology

## 2014-08-16 ENCOUNTER — Other Ambulatory Visit: Payer: Self-pay | Admitting: Internal Medicine

## 2014-08-16 ENCOUNTER — Ambulatory Visit
Admission: RE | Admit: 2014-08-16 | Discharge: 2014-08-16 | Disposition: A | Discharge status: Home / Self Care | Payer: BC Managed Care – PPO | Source: Ambulatory Visit | Attending: Internal Medicine | Admitting: Internal Medicine

## 2014-08-16 DIAGNOSIS — R0609 Other forms of dyspnea: Principal | ICD-10-CM

## 2014-08-21 ENCOUNTER — Ambulatory Visit (INDEPENDENT_AMBULATORY_CARE_PROVIDER_SITE_OTHER): Payer: BC Managed Care – PPO

## 2014-08-21 ENCOUNTER — Ambulatory Visit (INDEPENDENT_AMBULATORY_CARE_PROVIDER_SITE_OTHER): Payer: BC Managed Care – PPO | Admitting: Emergency Medicine

## 2014-08-21 VITALS — BP 121/79 | HR 72 | Temp 98.4°F | Resp 16 | Ht 73.0 in | Wt 258.0 lb

## 2014-08-21 DIAGNOSIS — S6982XA Other specified injuries of left wrist, hand and finger(s), initial encounter: Secondary | ICD-10-CM

## 2014-08-21 DIAGNOSIS — Z23 Encounter for immunization: Secondary | ICD-10-CM

## 2014-08-21 DIAGNOSIS — S6992XA Unspecified injury of left wrist, hand and finger(s), initial encounter: Secondary | ICD-10-CM

## 2014-08-21 MED ORDER — CEPHALEXIN 500 MG PO CAPS: 500.0000 mg | ORAL_CAPSULE | Freq: Three times a day (TID) | ORAL | Status: DC

## 2014-08-21 NOTE — Addendum Note (Signed)
Addended by: Madie Reno on: 08/21/2014 05:21 PM   Modules accepted: Level of Service

## 2014-08-21 NOTE — Progress Notes (Signed)
Subjective:  This chart was scribed for Sergio Jordan, MD by Dellis Filbert, ED Scribe at [facility].The patient was seen in exam room 05 and the patient's care was started at 3:35 PM.   Patient ID: [name], male    DOB: 1960-12-22, 53 y.o.   MRN: [id number]  HPI  HPI Comments: [name] is a 53 y.o. male who presents to [facility] complaining of left middle finger infection. About two weeks ago the pt was drilling and his finger got caught under the drill. He was wearing a gloves at the time of the injury. Pt tried an over the counter lotion and this has not provided much relief. Pt says the site of the injury is very tender.  Pt cannot remember the last time he took his tetanus shot. He is otherwise healthy. 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  There are no active problems to display for this patient.  Past Medical History  Diagnosis Date  . Bladder cancer    Past Surgical History  Procedure Laterality Date  . Bladder surgery    . Transurethral resection of bladder tumor  09/13/2011    Procedure: TRANSURETHRAL RESECTION OF BLADDER TUMOR (TURBT);  Surgeon: Marissa Nestle;  Location: [facility];  Service: Urology;  Laterality: N/A;  large bladder tumor  . Cystoscopy  01/10/2012    Procedure: CYSTOSCOPY;  Surgeon: Marissa Nestle, MD;  Location: [facility];  Service: Urology;  Laterality: N/A;  . Transurethral resection of bladder tumor  01/10/2012    Procedure: TRANSURETHRAL RESECTION OF BLADDER TUMOR (TURBT);  Surgeon: Marissa Nestle, MD;  Location: [facility];  Service: Urology;  Laterality: N/A;   No Known Allergies Prior to Admission medications   Not on File   History   Social History  . Marital Status: Married    Spouse Name: N/A    Number of Children: N/A  . Years of Education: N/A   Occupational History  . Not on file.   Social History Main Topics  . Smoking status: Former Smoker -- 1.00 packs/day for 20 years    Types: Cigarettes    Quit  date: 09/07/2007  . Smokeless tobacco: Never Used  . Alcohol Use: No  . Drug Use: No  . Sexual Activity: Yes    Birth Control/ Protection: None   Other Topics Concern  . Not on file   Social History Narrative   Review of Systems  Skin: Positive for color change and wound.      Objective:  BP 121/79 mmHg  Pulse 72  Temp(Src) 98.4 F (36.9 C) (Oral)  Resp 16  Ht 6\' 1"  (1.854 m)  Wt 258 lb (117.028 kg)  BMI 34.05 kg/m2  SpO2 99%  Physical Exam  Constitutional: He is oriented to person, place, and time. He appears well-developed and well-nourished.  HENT:  Head: Normocephalic and atraumatic.  Eyes: EOM are normal.  Neck: Normal range of motion.  Cardiovascular: Normal rate.   Pulmonary/Chest: Effort normal.  Musculoskeletal: Normal range of motion.  Adjacent to the nail of the left middle finger is a 10 by 3 mm area of inflamed tissue consistent of a pyogenic granuloma.   Neurological: He is alert and oriented to person, place, and time.  Skin: Skin is warm and dry.  Psychiatric: He has a normal mood and affect. His behavior is normal.  Nursing note and vitals reviewed.  [facility] reading (PRIMARY) by  Dr Everlene Farrier x-rays of the  middle finger are normal.        Assessment & Plan:  Area of infection will be debrided. He will be on cephalexin 500 3 times a day for 1 week culture will be done

## 2014-08-21 NOTE — Patient Instructions (Signed)
Keep the dressing clean and dry for 24 hours. Then remove the bandage and wash with soap and water. You may apply a new bandage if desired, but be sure to let the wound get air and completely dry for several hours each day. Once the area dries completely, you no longer need a bandage of any kind. Continue daily washing with soap and water until the wound it healed. You may apply an antibiotic ointment to the area twice daily.

## 2014-08-21 NOTE — Progress Notes (Signed)
Verbal Consent Obtained from the patient. Digital block with 5 cc 2% lidocaine plain. Sterile Prep and Drape. Granulomatous tissue removed with scissors and pickups. Cleansed and dressed.

## 2014-08-24 LAB — WOUND CULTURE
Gram Stain: NONE SEEN
Organism ID, Bacteria: NO GROWTH

## 2014-09-02 ENCOUNTER — Telehealth: Payer: Self-pay

## 2014-09-02 NOTE — Telephone Encounter (Signed)
The patient called to inquire about a Rx refill of antibiotics for his infected finger.  He saw Dr. Everlene Farrier on 07/26/14.  The patient said that it was not yet completely healed, and he wanted to know if he could receive a refill of his antibiotic.  I told him that he may have to be seen by a doctor to follow up and determine if a refill is needed.  Please advise.  CB#: [phone]

## 2014-09-03 NOTE — Telephone Encounter (Signed)
LM to have pt RTC

## 2014-09-09 ENCOUNTER — Ambulatory Visit (INDEPENDENT_AMBULATORY_CARE_PROVIDER_SITE_OTHER): Payer: BC Managed Care – PPO | Admitting: Cardiovascular Disease

## 2014-09-09 ENCOUNTER — Encounter: Payer: Self-pay | Admitting: Cardiovascular Disease

## 2014-09-09 VITALS — BP 106/74 | HR 76 | Ht 73.0 in | Wt 260.8 lb

## 2014-09-09 DIAGNOSIS — R0609 Other forms of dyspnea: Secondary | ICD-10-CM

## 2014-09-09 DIAGNOSIS — C679 Malignant neoplasm of bladder, unspecified: Secondary | ICD-10-CM | POA: Insufficient documentation

## 2014-09-09 NOTE — Progress Notes (Signed)
[name] Date of Birth  Jul 26, 1961       [place] [address], [place], [place]  [address]   [place], [place]  [address] [place]   Fax  [phone]     Fax [phone]  Problem List: 1. Exertional dyspnea   History of Present Illness:  [name] is a 53 yo  From [place]-  who is referred by Dr. Lysle Rubens for evaluation of exertional dyspnea.  [name] has been having these symptoms for 1 1/2 years.  Has  Does not do regular exercise. 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  He has noticed that he is more short of breath recently.  Does some yard work, has some difficulty with that. No CP,  Occasional gas pain.   Resolves after 10-15 minutes  After he stops working.   No PND or orthopnea. Some mild leg swelling - typically at the end of day.  Goes down at night.   Eats   No blood in stool  No hx of anemia CXR at primary MDs office was normal   Former smoker ETOH - none Fhx:  Mother had atrial fib, CHF, LVAD at [facility]   No current outpatient prescriptions on file prior to visit.   No current facility-administered medications on file prior to visit.    No Known Allergies  Past Medical History  Diagnosis Date  . Bladder cancer     Past Surgical History  Procedure Laterality Date  . Bladder surgery    . Transurethral resection of bladder tumor  09/13/2011    Procedure: TRANSURETHRAL RESECTION OF BLADDER TUMOR (TURBT);  Surgeon: Marissa Nestle;  Location: [facility];  Service: Urology;  Laterality: N/A;  large bladder tumor  . Cystoscopy  01/10/2012    Procedure: CYSTOSCOPY;  Surgeon: Marissa Nestle, MD;  Location: [facility];  Service: Urology;  Laterality: N/A;  . Transurethral resection of bladder tumor  01/10/2012    Procedure: TRANSURETHRAL RESECTION OF BLADDER TUMOR (TURBT);  Surgeon: Marissa Nestle, MD;  Location: [facility];  Service: Urology;   Laterality: N/A;    History  Smoking status  . Former Smoker -- 1.00 packs/day for 20 years  . Types: Cigarettes  . Quit date: 09/07/2007  Smokeless tobacco  . Never Used    History  Alcohol Use No    Family History  Problem Relation Age of Onset  . Anesthesia problems Neg Hx   . Hypotension Neg Hx   . Malignant hyperthermia Neg Hx   . Pseudochol deficiency Neg Hx     Reviw of Systems:  Reviewed in the HPI.  All other systems are negative.  Physical Exam: Blood pressure 106/74, pulse 76, height 6\' 1"  (1.854 m), weight 260 lb 12.8 oz (118.298 kg). Wt Readings from Last 3 Encounters:  09/09/14 260 lb 12.8 oz (118.298 kg)  08/21/14 258 lb (117.028 kg)  01/10/12 260 lb (117.935 kg)     General: Well developed, well nourished, in no acute distress.  Head: Normocephalic, atraumatic, sclera non-icteric, mucus membranes are moist,   Neck: Supple. Carotids are 2 + without bruits. No JVD   Lungs: Clear   Heart: RR, normal s1s2, no murmus  Abdomen: Soft, non-tender, non-distended with normal bowel sounds.  Msk:  Strength and tone are normal   Extremities: No clubbing or cyanosis. No edema.  Distal pedal pulses  are 2+ and equal    Neuro: CN II - XII intact.  Alert and oriented X 3.   Psych:  Normal   ECG: 09/09/2014: Normal sinus rhythm at 76. His left axis deviation. There are no ST or T wave changes.  Assessment / Plan:

## 2014-09-09 NOTE — Patient Instructions (Signed)
Your physician recommends that you continue on your current medications as directed. Please refer to the Current Medication list given to you today.  Your physician has requested that you have a stress echocardiogram. For further information please visit [email or web address]. Please follow instruction sheet as given.  Your physician has requested that you have an echocardiogram. Echocardiography is a painless test that uses sound waves to create images of your heart. It provides your doctor with information about the size and shape of your heart and how well your heart's chambers and valves are working. This procedure takes approximately one hour. There are no restrictions for this procedure.  Your physician recommends that you schedule a follow-up appointment in: 2 months with Dr. Acie Fredrickson.    Exercise Stress Echocardiogram An exercise stress echocardiogram is a heart (cardiac) test used to check the function of your heart. This test may also be called an exercise stress echocardiography or stress echo. This stress test will check how well your heart muscle and valves are working and determine if your heart muscle is getting enough blood. You will exercise on a treadmill to naturally increase or stress the functioning of your heart.  An echocardiogram uses sound waves (ultrasound) to produce an image of your heart. If your heart does not work normally, it may indicate coronary artery disease with poor coronary blood supply. The coronary arteries are the arteries that bring blood and oxygen to your heart. LET [facility] CARE PROVIDER KNOW ABOUT:  Any allergies you have.  All medicines you are taking, including vitamins, herbs, eye drops, creams, and over-the-counter medicines.  Previous problems you or members of your family have had with the use of anesthetics.  Any blood disorders you have.  Previous surgeries you have had.  Medical conditions you have.  Possibility of pregnancy, if this  applies. RISKS AND COMPLICATIONS Generally, this is a safe procedure. However, as with any procedure, complications can occur. Possible complications can include:  You develop pain or pressure in the following areas:  Chest.  Jaw or neck.  Between your shoulder blades.  Radiating down your left arm.  Dizziness or lightheadedness.  Shortness of breath.  Increased or irregular heartbeat.  Nausea or vomiting.  Heart attack (rare). BEFORE THE PROCEDURE  Avoid all forms of caffeine for 24 hours before your test or as directed by your health care provider. This includes coffee, tea (even decaffeinated tea), caffeinated sodas, chocolate, cocoa, and certain pain medicines.  Follow your health care provider's instructions regarding eating and drinking before the test.  Take your medicines as directed at regular times with water unless instructed otherwise. Exceptions may include:  If you have diabetes, ask how you are to take your insulin or pills. It is common to adjust insulin dosing the morning of the test.  If you are taking beta-blocker medicines, it is important to talk to your health care provider about these medicines well before the date of your test. Taking beta-blocker medicines may interfere with the test. In some cases, these medicines need to be changed or stopped 24 hours or more before the test.  If you wear a nitroglycerin patch, it may need to be removed prior to the test. Ask your health care provider if the patch should be removed before the test.  If you use an inhaler for any breathing condition, bring it with you to the test.  If you are an outpatient, bring a snack so you can eat right after the stress phase of the  test.  Do not smoke for 4 hours prior to the test or as directed by your health care provider.  Wear loose-fitting clothes and comfortable shoes for the test. This test involves walking on a treadmill. PROCEDURE   Multiple electrodes will be put  on your chest. If needed, small areas of your chest may be shaved to get better contact with the electrodes. Once the electrodes are attached to your body, multiple wires will be attached to the electrodes, and your heart rate will be monitored.  You will have an echocardiogram done at rest.  To produce this image of your heart, gel is applied to your chest, and a wand-like tool (transducer) is moved over the chest. The transducer sends the sound waves through the chest to create the moving images of your heart.  You may need an IV to receive a medication that improves the quality of the pictures.  You will then walk on a treadmill. The treadmill will be started at a slow pace. The treadmill speed and incline will gradually be increased to raise your heart rate.  At the peak of exercise, the treadmill will be stopped. You will lie down immediately on a bed so that a second echocardiogram can be done to visualize your heart's motion with exercise.  The test usually takes 30-60 minutes to complete. AFTER THE PROCEDURE  Your heart rate and blood pressure will be monitored after the test.  You may return to your normal schedule, including diet, activities, and medicines, unless your health care provider tells you otherwise. Document Released: 09/14/2004 Document Revised: 09/15/2013 Document Reviewed: 05/18/2013 [facility] Patient Information 2015 [place], [place]. This information is not intended to replace advice given to you by your health care provider. Make sure you discuss any questions you have with your health care provider.   Echocardiogram An echocardiogram, or echocardiography, uses sound waves (ultrasound) to produce an image of your heart. The echocardiogram is simple, painless, obtained within a short period of time, and offers valuable information to your health care provider. The images from an echocardiogram can provide information such as:  Evidence of coronary artery disease  (CAD).  Heart size.  Heart muscle function.  Heart valve function.  Aneurysm detection.  Evidence of a past heart attack.  Fluid buildup around the heart.  Heart muscle thickening.  Assess heart valve function. LET [facility] CARE PROVIDER KNOW ABOUT:  Any allergies you have.  All medicines you are taking, including vitamins, herbs, eye drops, creams, and over-the-counter medicines.  Previous problems you or members of your family have had with the use of anesthetics.  Any blood disorders you have.  Previous surgeries you have had.  Medical conditions you have.  Possibility of pregnancy, if this applies. BEFORE THE PROCEDURE  No special preparation is needed. Eat and drink normally.  PROCEDURE   In order to produce an image of your heart, gel will be applied to your chest and a wand-like tool (transducer) will be moved over your chest. The gel will help transmit the sound waves from the transducer. The sound waves will harmlessly bounce off your heart to allow the heart images to be captured in real-time motion. These images will then be recorded.  You may need an IV to receive a medicine that improves the quality of the pictures. AFTER THE PROCEDURE You may return to your normal schedule including diet, activities, and medicines, unless your health care provider tells you otherwise. Document Released: 09/07/2000 Document Revised: 01/25/2014 Document Reviewed:  05/18/2013 [employer] Patient Information 2015 [place], [place]. This information is not intended to replace advice given to you by your health care provider. Make sure you discuss any questions you have with your health care provider.

## 2014-09-09 NOTE — Assessment & Plan Note (Signed)
The patient presents with a year of progressive dyspnea with exertion. He denies any chest pain.  He does not ext much salt.  Will get a regular echo and also a stress echo next week.    I will see him in 2 months for follow up visit

## 2014-09-15 ENCOUNTER — Other Ambulatory Visit (HOSPITAL_COMMUNITY): Payer: BC Managed Care – PPO

## 2014-09-22 ENCOUNTER — Ambulatory Visit (HOSPITAL_COMMUNITY): Payer: BC Managed Care – PPO | Attending: Cardiology | Admitting: Radiology

## 2014-09-22 ENCOUNTER — Ambulatory Visit (HOSPITAL_BASED_OUTPATIENT_CLINIC_OR_DEPARTMENT_OTHER): Payer: BC Managed Care – PPO | Admitting: Radiology

## 2014-09-22 ENCOUNTER — Ambulatory Visit (HOSPITAL_COMMUNITY): Payer: BC Managed Care – PPO

## 2014-09-22 DIAGNOSIS — R0600 Dyspnea, unspecified: Secondary | ICD-10-CM | POA: Diagnosis present

## 2014-09-22 DIAGNOSIS — R0609 Other forms of dyspnea: Secondary | ICD-10-CM

## 2014-09-22 NOTE — Progress Notes (Signed)
Stress Echocardiogram performed.  

## 2014-09-22 NOTE — Progress Notes (Signed)
Echocardiogram performed.  

## 2014-09-29 ENCOUNTER — Telehealth: Payer: Self-pay | Admitting: Cardiovascular Disease

## 2014-09-29 NOTE — Telephone Encounter (Signed)
New message ° ° ° ° ° °Want echo results °

## 2014-09-29 NOTE — Telephone Encounter (Signed)
**Note De-Identified [name] Obfuscation** The pt is advised of his Echo results, he verbalized understanding.

## 2014-11-11 ENCOUNTER — Ambulatory Visit: Payer: BC Managed Care – PPO | Admitting: Cardiovascular Disease

## 2015-12-09 IMAGING — CR DG CHEST 2V
2 series · 2 of 2 positions shown · non-contrast
Comparison: 12/13/2009

CLINICAL DATA: Dyspnea on exertion.

EXAM:
CHEST  2 VIEW

[w chest pa]
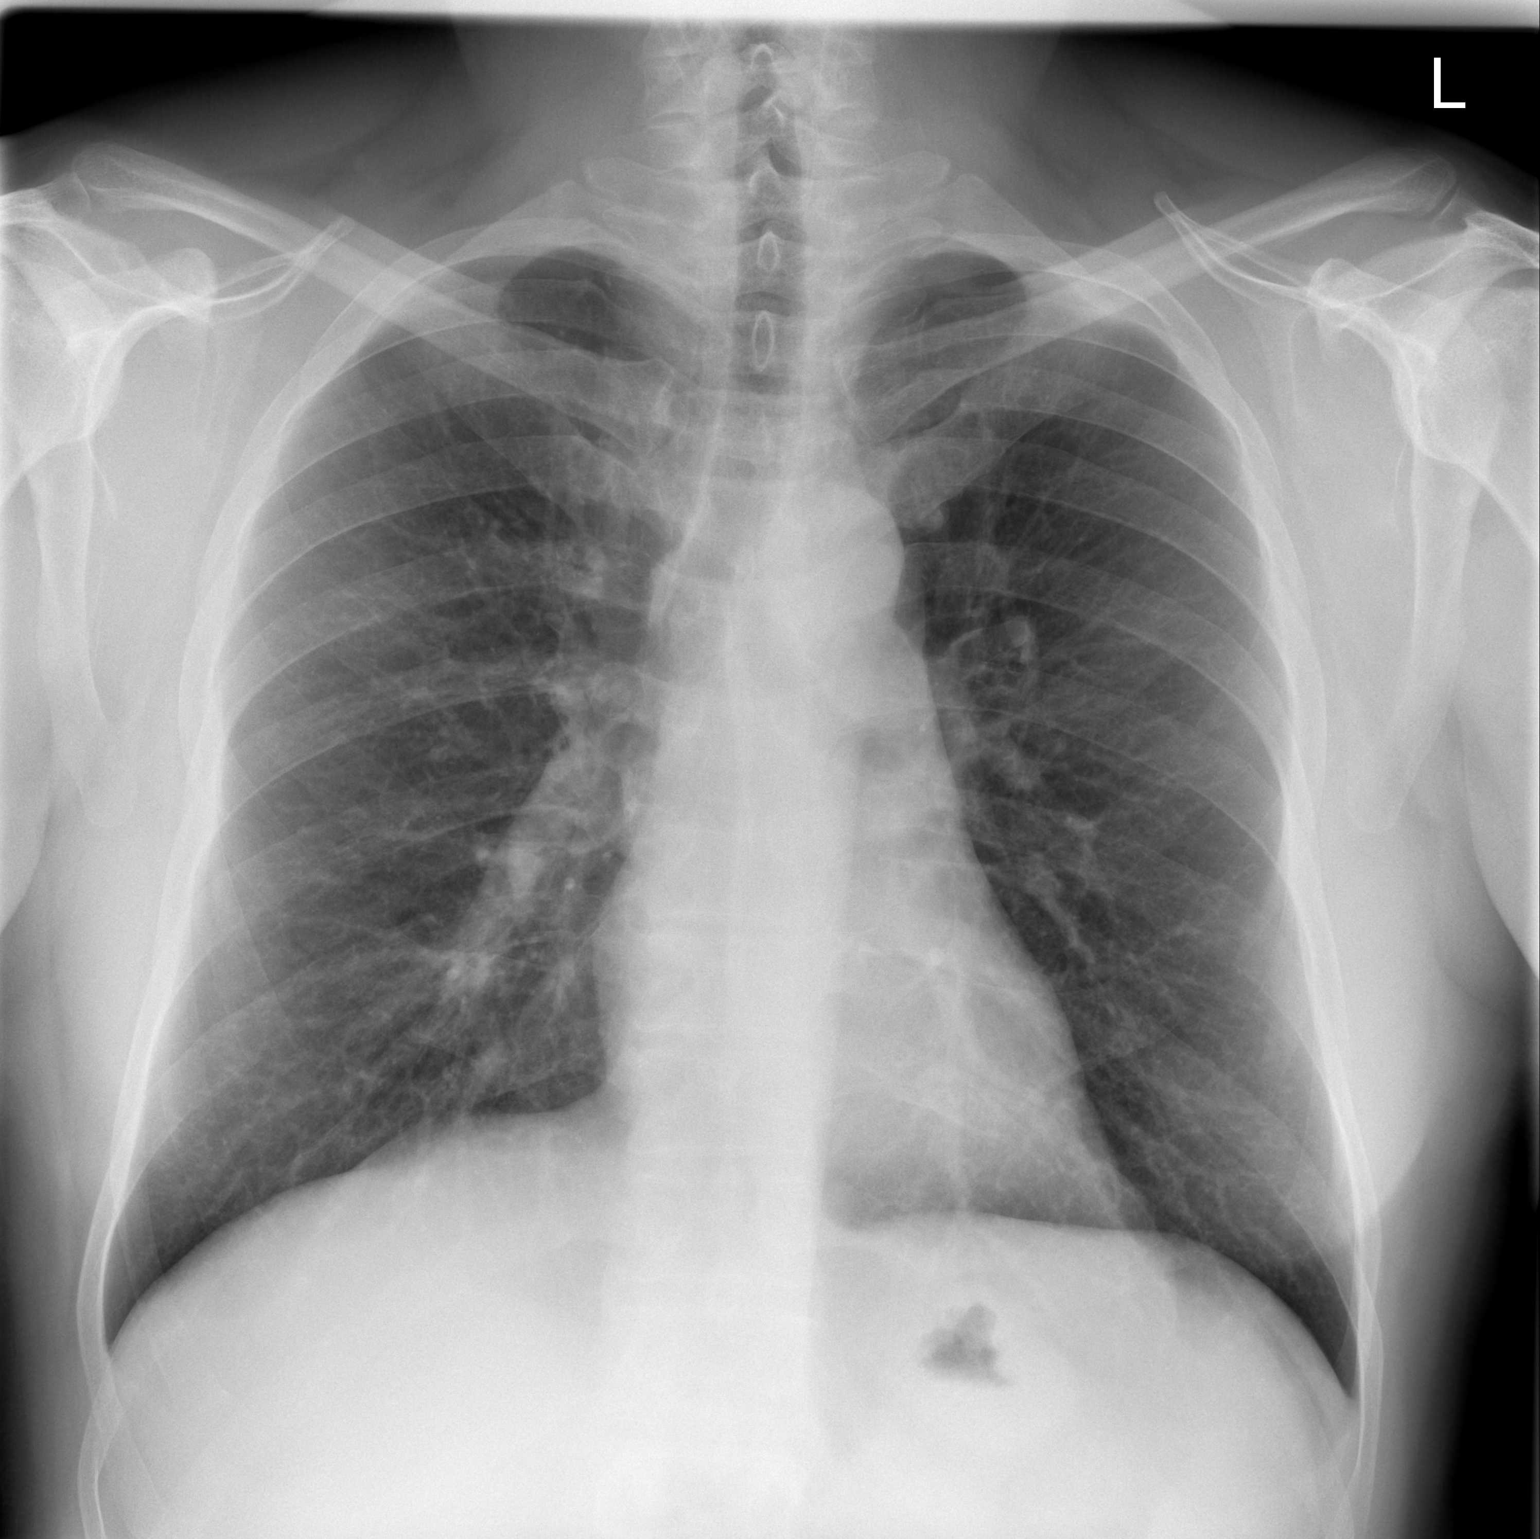

[w chest lat]
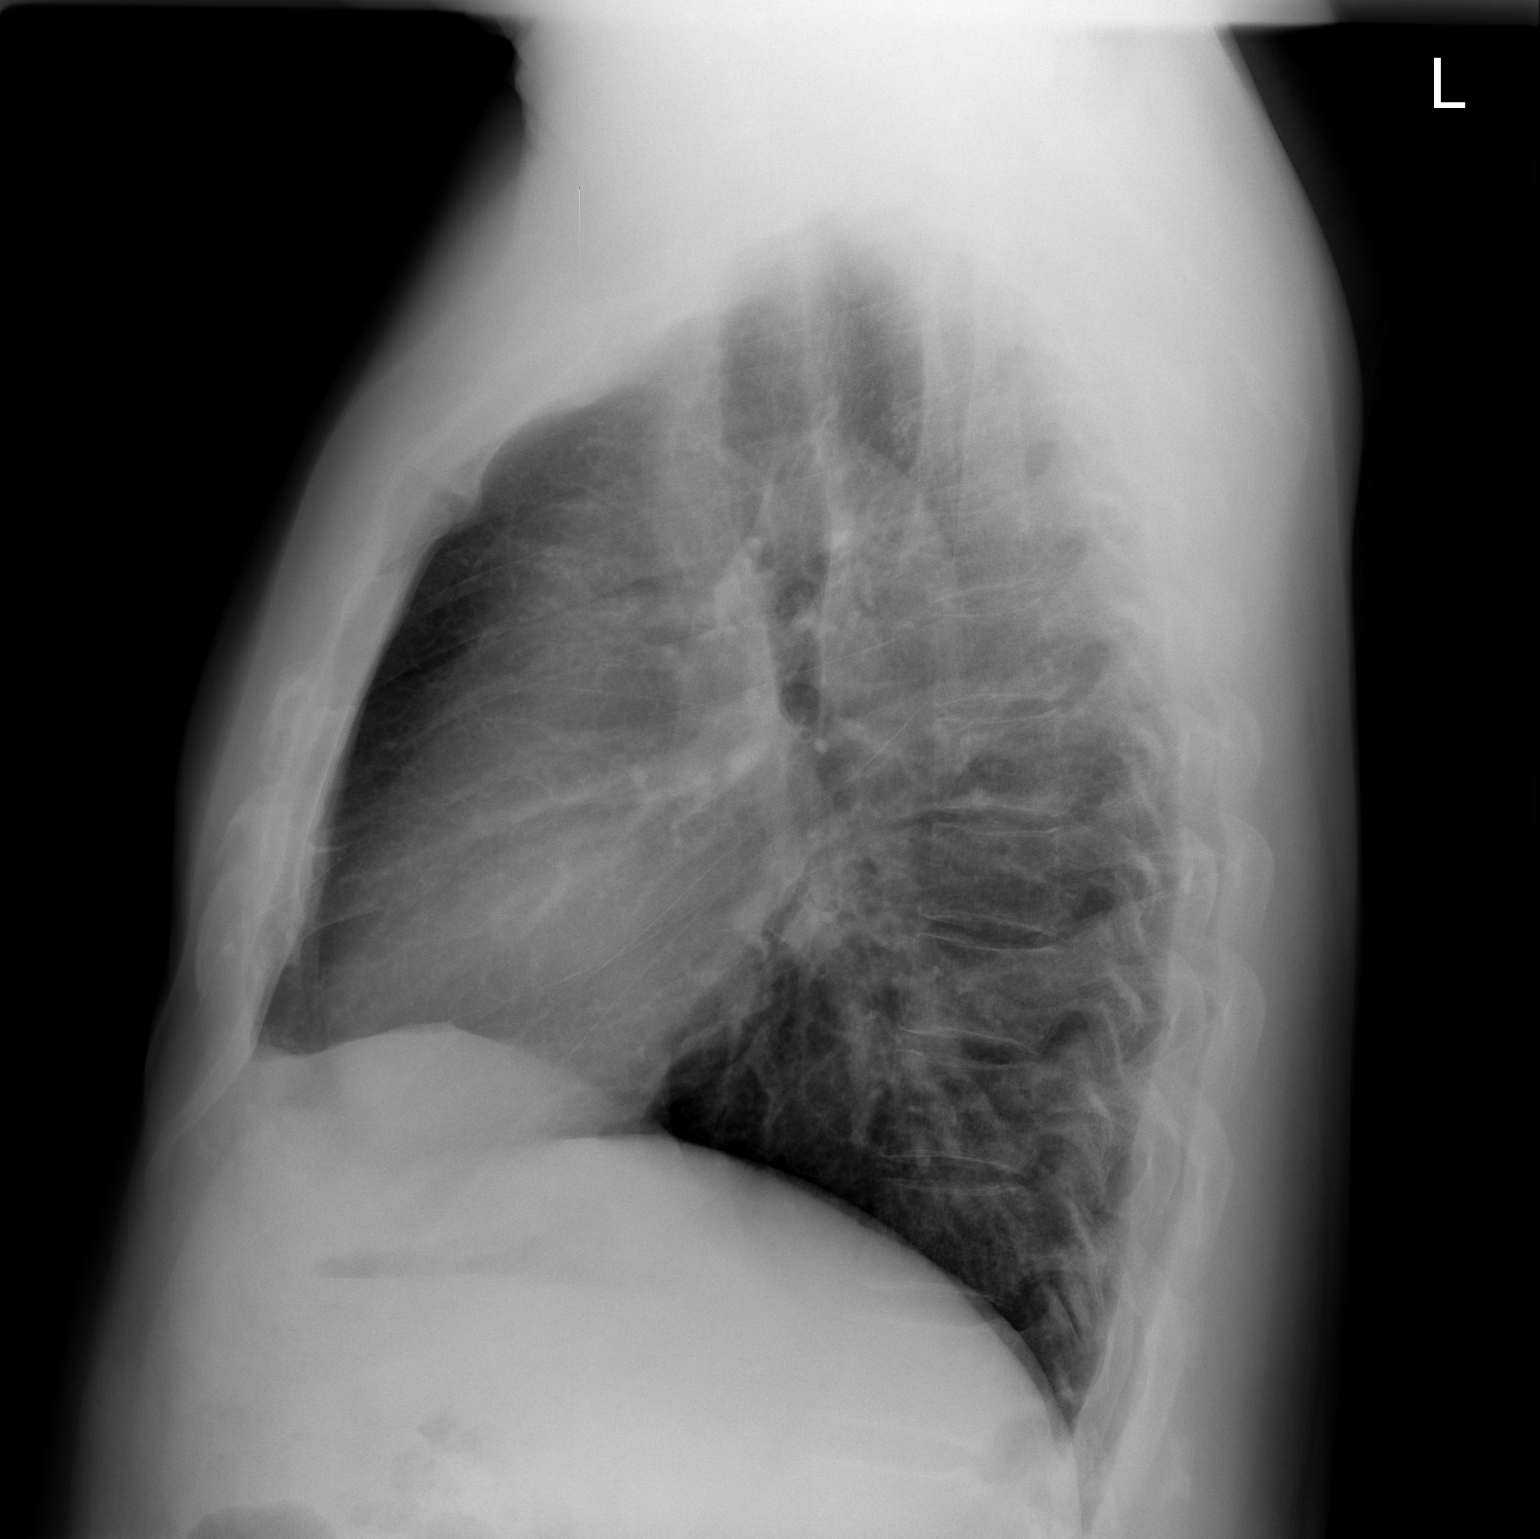

[2 of 2 positions shown; findings below may reference images not displayed]

FINDINGS: The heart size and mediastinal contours are within normal limits.
Both lungs are clear. The visualized skeletal structures are
unremarkable.
IMPRESSION: No active cardiopulmonary disease.

## 2015-12-14 IMAGING — CR DG FINGER MIDDLE 2+V*L*
1 series · 1 of 1 positions shown · non-contrast
Comparison: None.

CLINICAL DATA: Trauma to the left middle finger while drilling 2
weeks ago. The finger is tender with a visible area of inflamed
tissue.

EXAM:
LEFT MIDDLE FINGER 2+V

[PA]
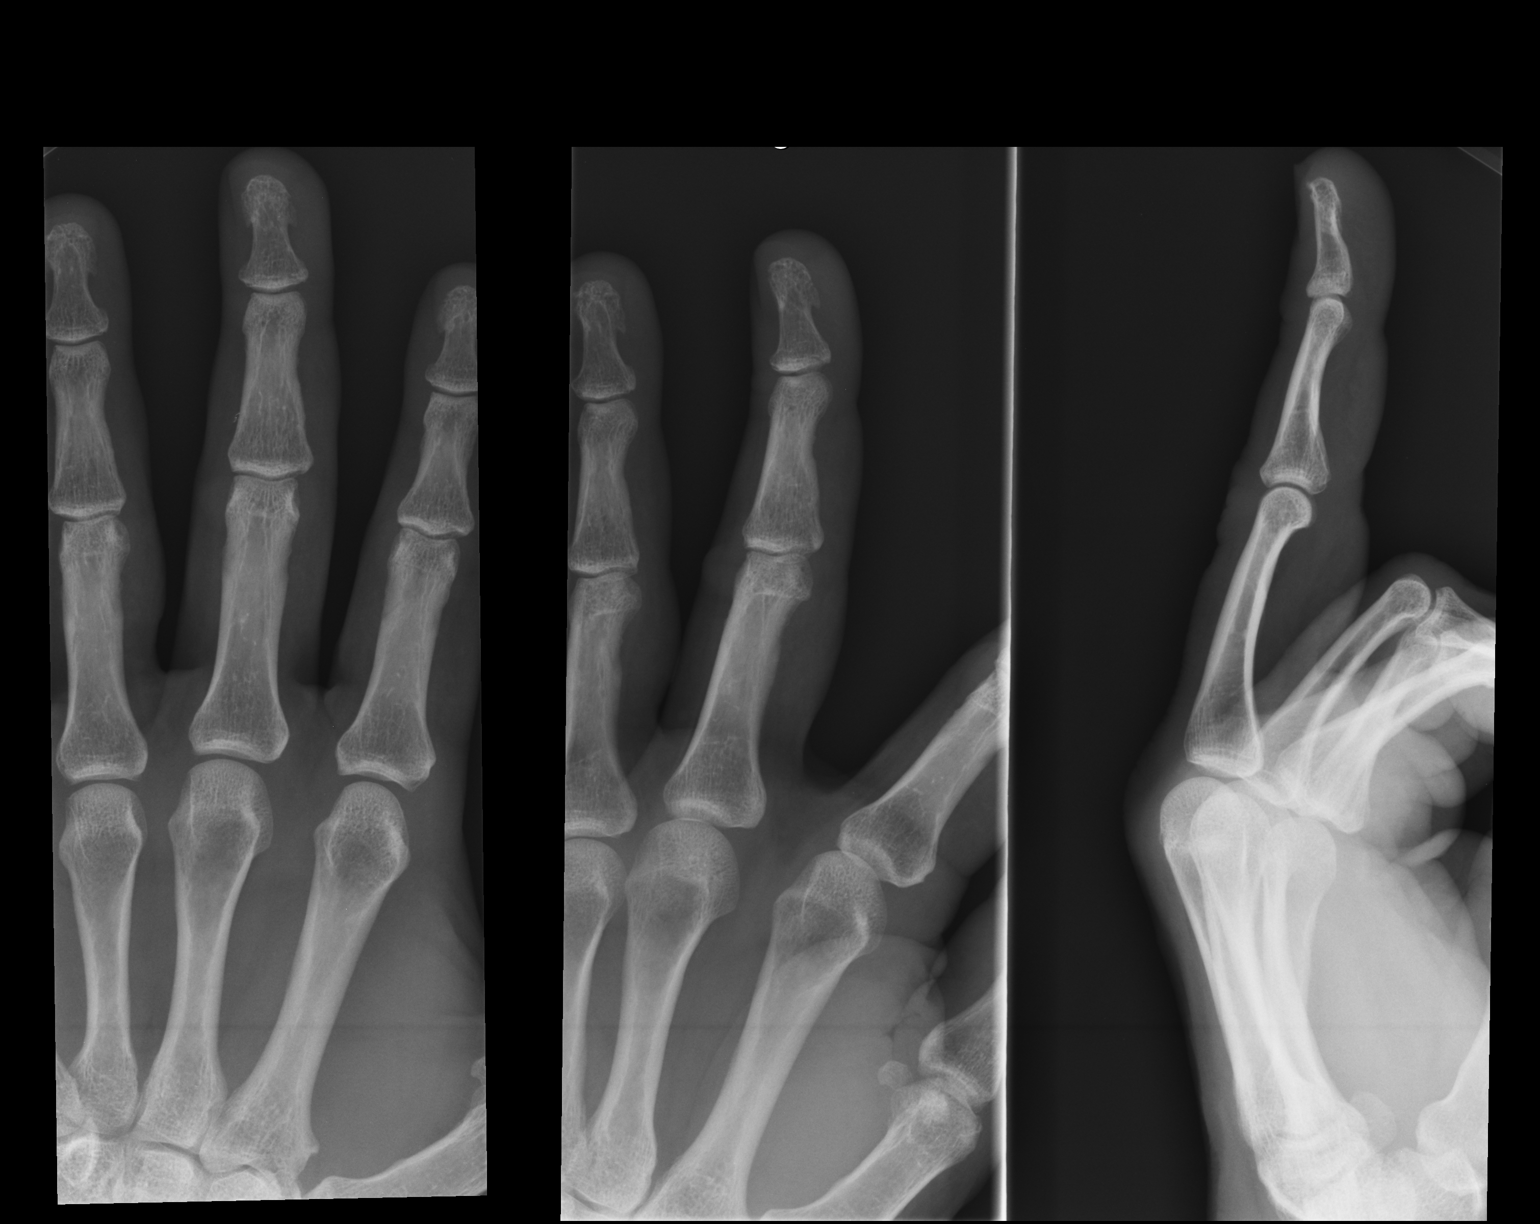

[1 of 1 positions shown; findings below may reference images not displayed]

FINDINGS: The osseous structures are normal. No radiodense foreign body in the
soft tissues.
IMPRESSION: Normal exam.

## 2016-03-01 ENCOUNTER — Ambulatory Visit (INDEPENDENT_AMBULATORY_CARE_PROVIDER_SITE_OTHER): Payer: Worker's Compensation | Admitting: Physician Assistant

## 2016-03-01 VITALS — BP 110/76 | HR 80 | Temp 98.1°F | Resp 16 | Ht 73.5 in | Wt 251.0 lb

## 2016-03-01 DIAGNOSIS — M79645 Pain in left finger(s): Secondary | ICD-10-CM

## 2016-03-01 NOTE — Patient Instructions (Addendum)
Signs of infection include exquisite tenderness, fever, redness about the wound that is worsening, pus, or streaking coming up the arm.  If these are present please call me so that I may place you on an antibiotic.     IF you received an x-ray today, you will receive an invoice from [facility]. Please contact [facility] at [phone] with questions or concerns regarding your invoice.   IF you received labwork today, you will receive an invoice from [employer]. Please contact [facility] at [phone] with questions or concerns regarding your invoice.   Our billing staff will not be able to assist you with questions regarding bills from these companies.  You will be contacted with the lab results as soon as they are available. The fastest way to get your results is to activate your My Chart account. Instructions are located on the last page of this paperwork. If you have not heard from [place] regarding the results in 2 weeks, please contact this office.

## 2016-03-01 NOTE — Progress Notes (Signed)
   03/01/2016 6:12 PM   DOB: 04-May-1961 / MRN: [id number]  SUBJECTIVE:  [name] is a 55 y.o. male presenting for thumb pain that started after trying to repair a leaky water valve at work. Reports there was a lot of water coming out under pressure and he does not remember exactly how he was injured, but noticed after that his left thumb was bleeding.  He has no difficulty with movement of the thumb and denies any nail changes.  He has No Known Allergies.   He  has a past medical history of Bladder cancer ([place]).    He  reports that he quit smoking about 8 years ago. His smoking use included Cigarettes. He has a 20 pack-year smoking history. He has never used smokeless tobacco. He reports that he does not drink alcohol or use illicit drugs. He  reports that he currently engages in sexual activity. He reports using the following method of birth control/protection: None. The patient  has past surgical history that includes Bladder surgery; Transurethral resection of bladder tumor (09/13/2011); Cystoscopy (01/10/2012); and Transurethral resection of bladder tumor (01/10/2012).  His family history is negative for Anesthesia problems, Hypotension, Malignant hyperthermia, and Pseudochol deficiency.  Review of Systems  Constitutional: Negative for fever and chills.  Gastrointestinal: Negative for nausea.  Musculoskeletal: Negative for myalgias.  Skin: Negative for rash.    Problem list and medications reviewed and updated by myself where necessary, and exist elsewhere in the encounter.   OBJECTIVE:  BP 110/76 mmHg  Pulse 80  Temp(Src) 98.1 F (36.7 C) (Oral)  Resp 16  Ht 6' 1.5" (1.867 m)  Wt 251 lb (113.853 kg)  BMI 32.66 kg/m2  SpO2 97%  Physical Exam  Constitutional: He is oriented to person, place, and time. He appears well-developed. He does not appear ill.  Eyes: Conjunctivae and EOM are normal. Pupils are equal, round, and reactive to light.  Cardiovascular: Normal rate.     Pulmonary/Chest: Effort normal.  Abdominal: He exhibits no distension.  Musculoskeletal: Normal range of motion.       Hands: Neurological: He is alert and oriented to person, place, and time. No cranial nerve deficit. Coordination normal.  Skin: Skin is warm and dry. He is not diaphoretic.  Psychiatric: He has a normal mood and affect.  Nursing note and vitals reviewed.       No results found for this or any previous visit (from the past 72 hour(s)).  No results found.  ASSESSMENT AND PLAN  Marcellis was seen today for hand injury.  Diagnoses and all orders for this visit:  Thumb pain, left: His exam is reassuring.  Advised that we watch this closely. Mupirocin ointment placed and RTC precautions discussed (see AVS).  Advised Ibuprofen 600 mg q6-8 hours.     The patient was advised to call or return to clinic if he does not see an improvement in symptoms or to seek the care of the closest emergency department if he worsens with the above plan.   Philis Fendt, MHS, PA-C [facility] 03/01/2016 6:12 PM

## 2016-06-29 ENCOUNTER — Ambulatory Visit
Admission: RE | Admit: 2016-06-29 | Discharge: 2016-06-29 | Disposition: A | Discharge status: Home / Self Care | Payer: 59 | Source: Ambulatory Visit | Attending: Internal Medicine | Admitting: Internal Medicine

## 2016-06-29 ENCOUNTER — Other Ambulatory Visit: Payer: Self-pay | Admitting: Internal Medicine

## 2016-06-29 DIAGNOSIS — M549 Dorsalgia, unspecified: Secondary | ICD-10-CM

## 2016-12-16 ENCOUNTER — Emergency Department (HOSPITAL_COMMUNITY)
Admission: EM | Admit: 2016-12-16 | Discharge: 2016-12-16 | Disposition: A | Discharge status: Home / Self Care | Payer: 59 | Attending: Emergency Medicine | Admitting: Emergency Medicine

## 2016-12-16 ENCOUNTER — Encounter (HOSPITAL_COMMUNITY): Payer: Self-pay

## 2016-12-16 DIAGNOSIS — S61209A Unspecified open wound of unspecified finger without damage to nail, initial encounter: Secondary | ICD-10-CM

## 2016-12-16 DIAGNOSIS — Y939 Activity, unspecified: Secondary | ICD-10-CM | POA: Insufficient documentation

## 2016-12-16 DIAGNOSIS — S61201A Unspecified open wound of left index finger without damage to nail, initial encounter: Secondary | ICD-10-CM | POA: Diagnosis present

## 2016-12-16 DIAGNOSIS — W268XXA Contact with other sharp object(s), not elsewhere classified, initial encounter: Secondary | ICD-10-CM | POA: Diagnosis not present

## 2016-12-16 DIAGNOSIS — Z8551 Personal history of malignant neoplasm of bladder: Secondary | ICD-10-CM | POA: Insufficient documentation

## 2016-12-16 DIAGNOSIS — Y999 Unspecified external cause status: Secondary | ICD-10-CM | POA: Insufficient documentation

## 2016-12-16 DIAGNOSIS — Z87891 Personal history of nicotine dependence: Secondary | ICD-10-CM | POA: Diagnosis not present

## 2016-12-16 DIAGNOSIS — Y929 Unspecified place or not applicable: Secondary | ICD-10-CM | POA: Insufficient documentation

## 2016-12-16 NOTE — Discharge Instructions (Signed)
Please read and follow all provided instructions.  Your diagnoses today include:  1. Avulsion of skin of finger, initial encounter     Tests performed today include: Vital signs. See below for your results today.   Medications prescribed:  Take as prescribed   Home care instructions:  Follow any educational materials contained in this packet.  Follow-up instructions: Please follow-up with your primary care provider for further evaluation of symptoms and treatment   Return instructions:  Please return to the Emergency Department if you do not get better, if you get worse, or new symptoms OR  - Fever (temperature greater than 101.84F)  - Bleeding that does not stop with holding pressure to the area    -Severe pain (please note that you may be more sore the day after your accident)  - Chest Pain  - Difficulty breathing  - Severe nausea or vomiting  - Inability to tolerate food and liquids  - Passing out  - Skin becoming red around your wounds  - Change in mental status (confusion or lethargy)  - New numbness or weakness    Please return if you have any other emergent concerns.  Additional Information:  Your vital signs today were: BP 127/82 (BP Location: Right Arm)    Pulse 85    Temp 98 F (36.7 C) (Oral)    Resp 18    SpO2 97%  If your blood pressure (BP) was elevated above 135/85 this visit, please have this repeated by your doctor within one month. ---------------

## 2016-12-16 NOTE — ED Provider Notes (Signed)
[facility] DEPT Provider Note   CSN: [id number] Arrival date & time: 12/16/16  2006   By signing my name below, I, Soijett Blue, attest that this documentation has been prepared under the direction and in the presence of Shary Decamp, PA-C Electronically Signed: Soijett Blue, ED Scribe. 12/16/16. 8:51 PM.  History   Chief Complaint Chief Complaint  Patient presents with  . Laceration    HPI [name] is a 56 y.o. male who presents to the Emergency Department complaining of left index finger laceration onset 5:30 PM. Pt reports associated left index finger pain. Pt has tried coffee grinds applied to the area without medications with no relief of his symptoms. He states that he was chopping parsley when he accidentally cut off the tip of his left index finger. Per chart review, pt last tetanus vaccination was 08/21/2014. He denies color change, swelling, numbness, tingling, and any other symptoms. Denies taking blood thinners at this time.   The history is provided by the patient. No language interpreter was used.    Past Medical History:  Diagnosis Date  . Bladder cancer [facility])     Patient Active Problem List   Diagnosis Date Noted  . Bladder cancer ([place]) 09/09/2014  . DOE (dyspnea on exertion) 09/09/2014    Past Surgical History:  Procedure Laterality Date  . BLADDER SURGERY    . CYSTOSCOPY  01/10/2012   Procedure: CYSTOSCOPY;  Surgeon: Marissa Nestle, MD;  Location: [facility];  Service: Urology;  Laterality: N/A;  . TRANSURETHRAL RESECTION OF BLADDER TUMOR  09/13/2011   Procedure: TRANSURETHRAL RESECTION OF BLADDER TUMOR (TURBT);  Surgeon: Marissa Nestle;  Location: [facility];  Service: Urology;  Laterality: N/A;  large bladder tumor  . TRANSURETHRAL RESECTION OF BLADDER TUMOR  01/10/2012   Procedure: TRANSURETHRAL RESECTION OF BLADDER TUMOR (TURBT);  Surgeon: Marissa Nestle, MD;  Location: [facility];  Service: Urology;  Laterality: N/A;       Home Medications     Prior to Admission medications   Not on File    Family History Family History  Problem Relation Age of Onset  . Anesthesia problems Neg Hx   . Hypotension Neg Hx   . Malignant hyperthermia Neg Hx   . Pseudochol deficiency Neg Hx     Social History Social History  Substance Use Topics  . Smoking status: Former Smoker    Packs/day: 1.00    Years: 20.00    Types: Cigarettes    Quit date: 09/07/2007  . Smokeless tobacco: Never Used  . Alcohol use No     Allergies   Patient has no known allergies.   Review of Systems Review of Systems  Musculoskeletal: Positive for arthralgias (left index finger). Negative for joint swelling.  Skin: Positive for wound (left index finger laceration). Negative for color change.  Neurological: Negative for numbness.       No tingling   Physical Exam Updated Vital Signs BP 127/82 (BP Location: Right Arm)   Pulse 85   Temp 98 F (36.7 C) (Oral)   Resp 18   SpO2 97%   Physical Exam  Constitutional: He is oriented to person, place, and time. He appears well-developed and well-nourished. No distress.  HENT:  Head: Normocephalic and atraumatic.  Eyes: EOM are normal.  Neck: Neck supple.  Cardiovascular: Normal rate.   Pulmonary/Chest: Effort normal. No respiratory distress.  Abdominal: He exhibits no distension.  Musculoskeletal: Normal range of motion.       Left hand:  He exhibits laceration.  Left index finger with superficial skin avulsion on distal palmar aspect.  No nailbed involvement.  Neurological: He is alert and oriented to person, place, and time.  Skin: Skin is warm and dry.  Psychiatric: He has a normal mood and affect. His behavior is normal.  Nursing note and vitals reviewed.    ED Treatments / Results  DIAGNOSTIC STUDIES: Oxygen Saturation is 97% on RA, nl by my interpretation.    COORDINATION OF CARE: 8:36 PM Discussed treatment plan with pt at bedside and pt agreed to plan.   Procedures Procedures  (including critical care time)  Medications Ordered in ED Medications - No data to display   Initial Impression / Assessment and Plan / ED Course  I have reviewed the triage vital signs and the nursing notes.  I have reviewed the relevant previous healthcare records. I obtained HPI from historian. Patient discussed with supervising physician.  ED Course:  Assessment: Patient is a 56 y.o. male that presents with skin avulsion to left index finger. No nailbed involvement is it was on palmar aspect due to knife cut. Tdap booster UTD. Pressure irrigation performed. Bottom of the wound visualized with bleeding controled. Laceration occurred < 8 hours prior to repair which was well tolerated. Used quikclot and sterile non-adhesive bandage wrapped in co-band. Pt has no co morbidities to effect normal wound healing. Discussed home wound care w pt and answered questions. Pt to f-u for wound check if symptoms worsen. Pt is hemodynamically stable w no complaints prior to dc.     Disposition/Plan:  DC home Additional Verbal discharge instructions given and discussed with patient.  Pt Instructed to f/u with PCP in the next week for evaluation and treatment of symptoms. Return precautions given Pt acknowledges and agrees with plan  Supervising Physician Duffy Bruce, MD  Final Clinical Impressions(s) / ED Diagnoses   Final diagnoses:  Avulsion of skin of finger, initial encounter    New Prescriptions New Prescriptions   No medications on file   I personally performed the services described in this documentation, which was scribed in my presence. The recorded information has been reviewed and is accurate.    Shary Decamp, PA-C 12/16/16 2056    Duffy Bruce, MD 12/17/16 2049

## 2016-12-16 NOTE — ED Notes (Signed)
See edp assessment 

## 2016-12-16 NOTE — ED Notes (Signed)
Pt states he was cooking dinner and he cut himself with a knife.

## 2016-12-16 NOTE — ED Triage Notes (Signed)
Pt complaining of lac to L second finger. Pt states sliced tip of finger off. Pt with small skin fragment in bag. Bleeding controlled at triage. Pt with full ROM.

## 2016-12-16 NOTE — ED Notes (Signed)
E-signature broke. Pt denies concerns with discharge

## 2019-05-27 ENCOUNTER — Other Ambulatory Visit: Payer: Self-pay | Admitting: Emergency Medicine

## 2019-05-27 DIAGNOSIS — Z20822 Contact with and (suspected) exposure to covid-19: Secondary | ICD-10-CM

## 2019-05-29 LAB — NOVEL CORONAVIRUS, NAA: SARS-CoV-2, NAA: NOT DETECTED

## 2019-11-17 ENCOUNTER — Ambulatory Visit: Payer: Managed Care, Other (non HMO) | Attending: Internal Medicine

## 2019-11-17 DIAGNOSIS — Z20822 Contact with and (suspected) exposure to covid-19: Secondary | ICD-10-CM

## 2019-11-18 LAB — NOVEL CORONAVIRUS, NAA: SARS-CoV-2, NAA: NOT DETECTED

## 2019-11-21 ENCOUNTER — Ambulatory Visit: Payer: Managed Care, Other (non HMO) | Attending: Internal Medicine

## 2019-11-21 DIAGNOSIS — Z23 Encounter for immunization: Secondary | ICD-10-CM | POA: Insufficient documentation

## 2019-11-21 NOTE — Progress Notes (Signed)
   Covid-19 Vaccination Clinic  Name:  [name]    MRN: [id number] DOB: 18-Jul-1961  11/21/2019  Mr. [name] was observed post Covid-19 immunization for 15 minutes without incidence. He was provided with Vaccine Information Sheet and instruction to access the V-Safe system.   Mr. [name] was instructed to call 911 with any severe reactions post vaccine: Marland Kitchen Difficulty breathing  . Swelling of your face and throat  . A fast heartbeat  . A bad rash all over your body  . Dizziness and weakness    Immunizations Administered    Name Date Dose VIS Date Route   Pfizer COVID-19 Vaccine 11/21/2019 10:12 AM 0.3 mL 09/04/2019 Intramuscular   Manufacturer: [place]   Lot: [id number]   [place]: [id number]

## 2019-12-16 ENCOUNTER — Ambulatory Visit: Payer: Managed Care, Other (non HMO) | Attending: Internal Medicine

## 2019-12-16 DIAGNOSIS — Z23 Encounter for immunization: Secondary | ICD-10-CM

## 2019-12-16 NOTE — Progress Notes (Signed)
   Covid-19 Vaccination Clinic  Name:  [name]    MRN: [id number] DOB: 07-Apr-1961  12/16/2019  Mr. [name] was observed post Covid-19 immunization for 15 minutes without incident. He was provided with Vaccine Information Sheet and instruction to access the V-Safe system.   Mr. [name] was instructed to call 911 with any severe reactions post vaccine: Marland Kitchen Difficulty breathing  . Swelling of face and throat  . A fast heartbeat  . A bad rash all over body  . Dizziness and weakness   Immunizations Administered    Name Date Dose VIS Date Route   Pfizer COVID-19 Vaccine 12/16/2019  2:55 PM 0.3 mL 09/04/2019 Intramuscular   Manufacturer: [place]   Lot: [id number]   [place]: [id number]

## 2020-06-03 ENCOUNTER — Other Ambulatory Visit: Payer: Self-pay | Admitting: Critical Care Medicine

## 2020-06-03 ENCOUNTER — Other Ambulatory Visit: Payer: Managed Care, Other (non HMO)

## 2020-06-03 DIAGNOSIS — Z20822 Contact with and (suspected) exposure to covid-19: Secondary | ICD-10-CM

## 2020-06-06 LAB — NOVEL CORONAVIRUS, NAA: SARS-CoV-2, NAA: NOT DETECTED

## 2020-09-06 ENCOUNTER — Other Ambulatory Visit: Payer: Managed Care, Other (non HMO)

## 2020-09-06 DIAGNOSIS — Z20822 Contact with and (suspected) exposure to covid-19: Secondary | ICD-10-CM

## 2020-09-07 LAB — NOVEL CORONAVIRUS, NAA: SARS-CoV-2, NAA: NOT DETECTED

## 2020-09-07 LAB — SARS-COV-2, NAA 2 DAY TAT

## 2020-09-08 ENCOUNTER — Telehealth: Payer: Self-pay | Admitting: *Deleted

## 2020-09-08 NOTE — Telephone Encounter (Signed)
Pt notified of negative COVID-19 results. Understanding verbalized.   

## 2020-12-31 ENCOUNTER — Emergency Department (HOSPITAL_BASED_OUTPATIENT_CLINIC_OR_DEPARTMENT_OTHER): Payer: Managed Care, Other (non HMO)

## 2020-12-31 ENCOUNTER — Other Ambulatory Visit: Payer: Self-pay

## 2020-12-31 ENCOUNTER — Emergency Department (HOSPITAL_BASED_OUTPATIENT_CLINIC_OR_DEPARTMENT_OTHER)
Admission: EM | Admit: 2020-12-31 | Discharge: 2020-12-31 | Disposition: A | Discharge status: Home / Self Care | Payer: Managed Care, Other (non HMO) | Attending: Emergency Medicine | Admitting: Emergency Medicine

## 2020-12-31 ENCOUNTER — Encounter (HOSPITAL_BASED_OUTPATIENT_CLINIC_OR_DEPARTMENT_OTHER): Payer: Self-pay | Admitting: Emergency Medicine

## 2020-12-31 DIAGNOSIS — R109 Unspecified abdominal pain: Secondary | ICD-10-CM

## 2020-12-31 DIAGNOSIS — R52 Pain, unspecified: Secondary | ICD-10-CM

## 2020-12-31 DIAGNOSIS — S301XXA Contusion of abdominal wall, initial encounter: Secondary | ICD-10-CM | POA: Insufficient documentation

## 2020-12-31 DIAGNOSIS — R0781 Pleurodynia: Secondary | ICD-10-CM | POA: Diagnosis not present

## 2020-12-31 DIAGNOSIS — W19XXXA Unspecified fall, initial encounter: Secondary | ICD-10-CM | POA: Diagnosis not present

## 2020-12-31 DIAGNOSIS — Y9301 Activity, walking, marching and hiking: Secondary | ICD-10-CM | POA: Insufficient documentation

## 2020-12-31 DIAGNOSIS — Z8551 Personal history of malignant neoplasm of bladder: Secondary | ICD-10-CM | POA: Insufficient documentation

## 2020-12-31 DIAGNOSIS — S3991XA Unspecified injury of abdomen, initial encounter: Secondary | ICD-10-CM | POA: Diagnosis present

## 2020-12-31 DIAGNOSIS — Z87891 Personal history of nicotine dependence: Secondary | ICD-10-CM | POA: Insufficient documentation

## 2020-12-31 NOTE — Discharge Instructions (Signed)
There are no obvious broken ribs.  Please use the incentive spirometer 10 minutes out of every hour that you are awake.  Please return for worsening difficulty breathing or if you develop a fever.  Return as well for abdominal pain blood in your urine or if you feel like you are going to pass out or pass out again.  Please follow-up with your family doctor.  Take 4 over the counter ibuprofen tablets 3 times a day or 2 over-the-counter naproxen tablets twice a day for pain. Also take tylenol 1000mg (2 extra strength) four times a day.

## 2020-12-31 NOTE — ED Notes (Signed)
Pt ambulatory to room, normal gait

## 2020-12-31 NOTE — ED Notes (Signed)
Patient transported to XR. 

## 2020-12-31 NOTE — ED Provider Notes (Addendum)
[facility] EMERGENCY DEPARTMENT Provider Note   CSN: [id number] Arrival date & time: 12/31/20  1127     History Chief Complaint  Patient presents with  . Fall  . Back Pain    [name] is a 60 y.o. male.  60 yo M with a chief complaint of right-sided flank pain.  Is after a fall this morning.  Patient tells me that he passed out he walked to open his curtains this morning and then collapsed to the ground.  He denies any obvious prodrome denies any chest pain shortness of breath headache or neck pain.  Feels completely fine now except for back pain after the fall.  Denies abdominal pain denies extremity pain.  He saw that he had quite a big bruise and came into the ED for evaluation.  The history is provided by the patient and the spouse.  Fall This is a new problem. The current episode started less than 1 hour ago. The problem occurs constantly. The problem has been resolved. Pertinent negatives include no chest pain, no abdominal pain, no headaches and no shortness of breath. The symptoms are aggravated by bending and twisting. Nothing relieves the symptoms. He has tried nothing for the symptoms. The treatment provided no relief.  Back Pain Associated symptoms: no abdominal pain, no chest pain, no fever and no headaches        Past Medical History:  Diagnosis Date  . Bladder cancer [facility])     Patient Active Problem List   Diagnosis Date Noted  . Bladder cancer ([place]) 09/09/2014  . DOE (dyspnea on exertion) 09/09/2014    Past Surgical History:  Procedure Laterality Date  . BLADDER SURGERY    . CYSTOSCOPY  01/10/2012   Procedure: CYSTOSCOPY;  Surgeon: Marissa Nestle, MD;  Location: [facility];  Service: Urology;  Laterality: N/A;  . TRANSURETHRAL RESECTION OF BLADDER TUMOR  09/13/2011   Procedure: TRANSURETHRAL RESECTION OF BLADDER TUMOR (TURBT);  Surgeon: Marissa Nestle;  Location: [facility];  Service: Urology;  Laterality: N/A;  large bladder tumor  .  TRANSURETHRAL RESECTION OF BLADDER TUMOR  01/10/2012   Procedure: TRANSURETHRAL RESECTION OF BLADDER TUMOR (TURBT);  Surgeon: Marissa Nestle, MD;  Location: [facility];  Service: Urology;  Laterality: N/A;       Family History  Problem Relation Age of Onset  . Anesthesia problems Neg Hx   . Hypotension Neg Hx   . Malignant hyperthermia Neg Hx   . Pseudochol deficiency Neg Hx     Social History   Tobacco Use  . Smoking status: Former Smoker    Packs/day: 1.00    Years: 20.00    Pack years: 20.00    Types: Cigarettes    Quit date: 09/07/2007    Years since quitting: 13.3  . Smokeless tobacco: Never Used  Substance Use Topics  . Alcohol use: No    Alcohol/week: 0.0 standard drinks  . Drug use: No    Home Medications Prior to Admission medications   Not on File    Allergies    Patient has no known allergies.  Review of Systems   Review of Systems  Constitutional: Negative for chills and fever.  HENT: Negative for congestion and facial swelling.   Eyes: Negative for discharge and visual disturbance.  Respiratory: Negative for shortness of breath.   Cardiovascular: Negative for chest pain and palpitations.  Gastrointestinal: Negative for abdominal pain, diarrhea and vomiting.  Genitourinary: Negative for hematuria.  Musculoskeletal: Positive for  back pain. Negative for arthralgias and myalgias.  Skin: Negative for color change and rash.  Neurological: Negative for tremors, syncope and headaches.  Psychiatric/Behavioral: Negative for confusion and dysphoric mood.    Physical Exam Updated Vital Signs BP (!) 147/87   Pulse 65   Temp 98 F (36.7 C) (Oral)   Resp 20   Ht 6\' 2"  (1.88 m)   Wt 113 kg   SpO2 100%   BMI 31.99 kg/m   Physical Exam Vitals and nursing note reviewed.  Constitutional:      Appearance: He is well-developed.  HENT:     Head: Normocephalic and atraumatic.  Eyes:     Pupils: Pupils are equal, round, and reactive to light.  Neck:      Vascular: No JVD.  Cardiovascular:     Rate and Rhythm: Normal rate and regular rhythm.     Heart sounds: No murmur heard. No friction rub. No gallop.   Pulmonary:     Effort: No respiratory distress.     Breath sounds: No wheezing.  Abdominal:     General: There is no distension.     Tenderness: There is no guarding or rebound.  Musculoskeletal:        General: Tenderness present. Normal range of motion.     Cervical back: Normal range of motion and neck supple.     Comments: Tenderness and bruise to the right flank at the costal margin along the medial scapular line.  No obvious abdominal tenderness.  Pain along the 8 through 10th ribs on the right.  Palpated from head to toe without any other noted areas of bony tenderness.  Skin:    Coloration: Skin is not pale.     Findings: No rash.  Neurological:     Mental Status: He is alert and oriented to person, place, and time.  Psychiatric:        Behavior: Behavior normal.     ED Results / Procedures / Treatments   Labs (all labs ordered are listed, but only abnormal results are displayed) Labs Reviewed - No data to display  EKG EKG Interpretation  Date/Time:  Saturday December 31 2020 12:15:30 EDT Ventricular Rate:  63 PR Interval:  203 QRS Duration: 101 QT Interval:  430 QTC Calculation: 441 R Axis:   -67 Text Interpretation: Sinus rhythm Borderline prolonged PR interval Left anterior fascicular block Abnormal R-wave progression, late transition no wpw, prolonged qt or brugada No significant change since last tracing Confirmed by Deno Etienne [phone]) on 12/31/2020 12:47:15 PM   Radiology DG Ribs Bilateral W/Chest  Result Date: 12/31/2020 CLINICAL DATA:  Pain following syncopal episode EXAM: BILATERAL RIBS AND CHEST - 4+ VIEW COMPARISON:  August 16, 2014 FINDINGS: Frontal chest as well as oblique and cone-down rib images were obtained. Lungs are clear. Heart size and pulmonary vascularity are normal. No adenopathy. No evident  pneumothorax or pleural effusion. No rib fracture evident. IMPRESSION: No evident rib fracture.  Lungs clear. Electronically Signed   By: Lowella Grip III M.D.   On: 12/31/2020 12:41    Procedures Procedures   Medications Ordered in ED Medications - No data to display  ED Course  I have reviewed the triage vital signs and the nursing notes.  Pertinent labs & imaging results that were available during my care of the patient were reviewed by me and considered in my medical decision making (see chart for details).    MDM Rules/Calculators/A&P  60 yo M with a chief complaints of right-sided rib pain after a fall.  He actually thinks that he passed out.  He is currently fasting for his religion and thinks he feels well.  Likely the cause of his event.  Will obtain an EKG.  Plain film of the ribs.  Well-appearing nontoxic will hold off on lab work or CT imaging at this time.  Plain film viewed by me without fracture or pneumothorax.  EKG without concerning finding.  Patient continues to feel well.  Will discharge home.  PCP follow-up.  12:46 PM:  I have discussed the diagnosis/risks/treatment options with the patient and family and believe the pt to be eligible for discharge home to follow-up with PCP. We also discussed returning to the ED immediately if new or worsening sx occur. We discussed the sx which are most concerning (e.g., sudden worsening pain, fever, inability to tolerate by mouth , difficulty breathing, abdominal pain, hematuria) that necessitate immediate return. Medications administered to the patient during their visit and any new prescriptions provided to the patient are listed below.  Medications given during this visit Medications - No data to display   The patient appears reasonably screen and/or stabilized for discharge and I doubt any other medical condition or other [facility] requiring further screening, evaluation, or treatment in the ED at this  time prior to discharge.   Final Clinical Impression(s) / ED Diagnoses Final diagnoses:  Bilateral flank pain    Rx / DC Orders ED Discharge Orders    None       Deno Etienne, DO 12/31/20 [place], DO 12/31/20 1247

## 2020-12-31 NOTE — ED Triage Notes (Signed)
Pt awoke this am, walked to curtains, awoke on the floor. Pt states apparently he had passed out for about a minute. Awoke on his back. Back is hurting, not sure if hit head, but no head pain.

## 2021-11-29 ENCOUNTER — Ambulatory Visit: Payer: Managed Care, Other (non HMO) | Admitting: Podiatry

## 2021-11-29 ENCOUNTER — Other Ambulatory Visit: Payer: Self-pay

## 2021-11-29 ENCOUNTER — Ambulatory Visit (INDEPENDENT_AMBULATORY_CARE_PROVIDER_SITE_OTHER): Payer: Managed Care, Other (non HMO)

## 2021-11-29 DIAGNOSIS — M79671 Pain in right foot: Secondary | ICD-10-CM

## 2021-11-29 DIAGNOSIS — S9032XA Contusion of left foot, initial encounter: Secondary | ICD-10-CM

## 2021-11-29 NOTE — Progress Notes (Signed)
? ?  HPI: 61 y.o. male presenting today as a new patient for evaluation of right foot pain.  Patient states that he was having some right foot pain for few months however over the last few days he changed his shoes into good supportive tennis shoes and he is taking some OTC Advil and his symptoms have almost completely resolved.  He used to wear work boots to work but he has been wearing tennis shoes with significant relief.  He presents for further treatment and evaluation ? ?Past Medical History:  ?Diagnosis Date  ? Bladder cancer ([place])   ? ? ?Past Surgical History:  ?Procedure Laterality Date  ? BLADDER SURGERY    ? CYSTOSCOPY  01/10/2012  ? Procedure: CYSTOSCOPY;  Surgeon: Marissa Nestle, MD;  Location: [facility];  Service: Urology;  Laterality: N/A;  ? TRANSURETHRAL RESECTION OF BLADDER TUMOR  09/13/2011  ? Procedure: TRANSURETHRAL RESECTION OF BLADDER TUMOR (TURBT);  Surgeon: Marissa Nestle;  Location: [facility];  Service: Urology;  Laterality: N/A;  large bladder tumor  ? TRANSURETHRAL RESECTION OF BLADDER TUMOR  01/10/2012  ? Procedure: TRANSURETHRAL RESECTION OF BLADDER TUMOR (TURBT);  Surgeon: Marissa Nestle, MD;  Location: [facility];  Service: Urology;  Laterality: N/A;  ? ? ?No Known Allergies ?  ?Physical Exam: ?General: The patient is alert and oriented x3 in no acute distress. ? ?Dermatology: Skin is warm, dry and supple bilateral lower extremities. Negative for open lesions or macerations. ? ?Vascular: Palpable pedal pulses bilaterally. Capillary refill within normal limits.  Negative for any significant edema or erythema ? ?Neurological: Light touch and protective threshold grossly intact ? ?Musculoskeletal Exam: No pedal deformities noted.  There is some slight tenderness with palpation to the second and third MTP joints of the right foot ? ?Radiographic Exam:  ?Normal osseous mineralization. Joint spaces preserved. No fracture/dislocation/boney destruction.  Posterior and plantar heel spur noted to  the left foot on lateral view ? ?Assessment: ?1.  Posterior heel spur left; asymptomatic ?2. 2nd and 3rd MTP capsulitis right; improving ? ? ?Plan of Care:  ?1. Patient evaluated. X-Rays reviewed.  ?2.  Over the last 2 days the patient states that the pain has subsided significantly because he is taking OTC Advil and new shoes. ?3.  No injections administered today ?4.  Continue wearing good supportive sneakers/tennis shoes.  Advised against wearing boots if possible ?5.  Return to clinic as needed ? ?*Works at [employer] ?  ?Edrick Kins, DPM ?[place] ? ?Dr. Edrick Kins, DPM  ?  ?[address]                                        ?[place], [place] [address]                ?Office [phone]  ?Fax [phone] ? ? ? ? ?

## 2022-06-15 ENCOUNTER — Other Ambulatory Visit: Payer: Self-pay | Admitting: Internal Medicine

## 2022-06-15 ENCOUNTER — Ambulatory Visit
Admission: RE | Admit: 2022-06-15 | Discharge: 2022-06-15 | Disposition: A | Discharge status: Home / Self Care | Payer: BC Managed Care – PPO | Source: Ambulatory Visit | Attending: Internal Medicine | Admitting: Internal Medicine

## 2022-06-15 DIAGNOSIS — M549 Dorsalgia, unspecified: Secondary | ICD-10-CM | POA: Diagnosis not present

## 2022-06-15 DIAGNOSIS — M5489 Other dorsalgia: Secondary | ICD-10-CM

## 2022-11-01 DIAGNOSIS — Z87891 Personal history of nicotine dependence: Secondary | ICD-10-CM | POA: Diagnosis not present

## 2022-11-01 DIAGNOSIS — E669 Obesity, unspecified: Secondary | ICD-10-CM | POA: Diagnosis not present

## 2022-11-01 DIAGNOSIS — Z125 Encounter for screening for malignant neoplasm of prostate: Secondary | ICD-10-CM | POA: Diagnosis not present

## 2022-11-01 DIAGNOSIS — Z6836 Body mass index (BMI) 36.0-36.9, adult: Secondary | ICD-10-CM | POA: Diagnosis not present

## 2022-11-01 DIAGNOSIS — Z8551 Personal history of malignant neoplasm of bladder: Secondary | ICD-10-CM | POA: Diagnosis not present

## 2022-11-01 DIAGNOSIS — Z0000 Encounter for general adult medical examination without abnormal findings: Secondary | ICD-10-CM | POA: Diagnosis not present

## 2022-11-01 DIAGNOSIS — E782 Mixed hyperlipidemia: Secondary | ICD-10-CM | POA: Diagnosis not present

## 2022-12-07 DIAGNOSIS — D696 Thrombocytopenia, unspecified: Secondary | ICD-10-CM | POA: Diagnosis not present

## 2022-12-07 DIAGNOSIS — R7309 Other abnormal glucose: Secondary | ICD-10-CM | POA: Diagnosis not present

## 2023-09-10 DIAGNOSIS — R053 Chronic cough: Secondary | ICD-10-CM | POA: Diagnosis not present

## 2023-11-06 DIAGNOSIS — Z0000 Encounter for general adult medical examination without abnormal findings: Secondary | ICD-10-CM | POA: Diagnosis not present

## 2023-11-06 DIAGNOSIS — Z125 Encounter for screening for malignant neoplasm of prostate: Secondary | ICD-10-CM | POA: Diagnosis not present

## 2023-11-06 DIAGNOSIS — E782 Mixed hyperlipidemia: Secondary | ICD-10-CM | POA: Diagnosis not present

## 2023-11-06 DIAGNOSIS — Z23 Encounter for immunization: Secondary | ICD-10-CM | POA: Diagnosis not present

## 2023-11-06 DIAGNOSIS — Z13220 Encounter for screening for lipoid disorders: Secondary | ICD-10-CM | POA: Diagnosis not present

## 2023-11-06 DIAGNOSIS — G479 Sleep disorder, unspecified: Secondary | ICD-10-CM | POA: Diagnosis not present

## 2023-11-06 DIAGNOSIS — K649 Unspecified hemorrhoids: Secondary | ICD-10-CM | POA: Diagnosis not present

## 2023-11-06 DIAGNOSIS — M519 Unspecified thoracic, thoracolumbar and lumbosacral intervertebral disc disorder: Secondary | ICD-10-CM | POA: Diagnosis not present

## 2023-11-27 DIAGNOSIS — R0683 Snoring: Secondary | ICD-10-CM | POA: Diagnosis not present

## 2023-12-25 DIAGNOSIS — G4733 Obstructive sleep apnea (adult) (pediatric): Secondary | ICD-10-CM | POA: Diagnosis not present

## 2023-12-30 DIAGNOSIS — G4733 Obstructive sleep apnea (adult) (pediatric): Secondary | ICD-10-CM | POA: Diagnosis not present

## 2024-02-17 ENCOUNTER — Ambulatory Visit (HOSPITAL_COMMUNITY)
Admission: EM | Admit: 2024-02-17 | Discharge: 2024-02-17 | Disposition: A | Discharge status: Home / Self Care | Attending: Family Medicine | Admitting: Family Medicine

## 2024-02-17 ENCOUNTER — Encounter (HOSPITAL_COMMUNITY): Payer: Self-pay

## 2024-02-17 DIAGNOSIS — M25511 Pain in right shoulder: Secondary | ICD-10-CM

## 2024-02-17 MED ORDER — MELOXICAM 15 MG PO TABS: ORAL_TABLET | ORAL | 0 refills | Status: AC

## 2024-02-17 NOTE — Discharge Instructions (Addendum)
 Please take your meloxicam 1 pill a day with food for the next 14 days.  Please do not take any other ibuprofen  or NSAIDs while you take this.  Please continue to ice the area for 20 minutes at a time at least twice a day.

## 2024-02-17 NOTE — ED Provider Notes (Signed)
 [facility]    CSN: [id number] Arrival date & time: 02/17/24  1013      History   Chief Complaint Chief Complaint  Patient presents with   Shoulder Injury    HPI Case [name] is a 63 y.o. male.   Patient presenting for right shoulder pain.  Patient notes that he was using a power washer 2 days ago and afterwards started having some pain over the next few hours that occurred on the humeral head.  Patient notes that the pain hurts whenever he lifts his arm above 90 degrees.  Patient is never had this pain before.  Patient still has full range of motion and strength but notes some pain with movement.  Patient states that he has a wedding on Friday for his time which is why he is here today to see if he can try and get some relief..   Shoulder Injury    Past Medical History:  Diagnosis Date   Bladder cancer [facility])     Patient Active Problem List   Diagnosis Date Noted   Bladder cancer (HCC) 09/09/2014   DOE (dyspnea on exertion) 09/09/2014    Past Surgical History:  Procedure Laterality Date   BLADDER SURGERY     CYSTOSCOPY  01/10/2012   Procedure: CYSTOSCOPY;  Surgeon: Reggie Caper, MD;  Location: [facility];  Service: Urology;  Laterality: N/A;   TRANSURETHRAL RESECTION OF BLADDER TUMOR  09/13/2011   Procedure: TRANSURETHRAL RESECTION OF BLADDER TUMOR (TURBT);  Surgeon: Mohammad I Javaid;  Location: [facility];  Service: Urology;  Laterality: N/A;  large bladder tumor   TRANSURETHRAL RESECTION OF BLADDER TUMOR  01/10/2012   Procedure: TRANSURETHRAL RESECTION OF BLADDER TUMOR (TURBT);  Surgeon: Mohammad I Javaid, MD;  Location: [facility];  Service: Urology;  Laterality: N/A;       Home Medications    Prior to Admission medications   Medication Sig Start Date End Date Taking? Authorizing Provider  meloxicam (MOBIC) 15 MG tablet Take 1 tablet daily with food for 14 days. Then take as needed. 02/17/24  Yes Jude Norton, MD    Family History Family History   Problem Relation Age of Onset   Anesthesia problems Neg Hx    Hypotension Neg Hx    Malignant hyperthermia Neg Hx    Pseudochol deficiency Neg Hx     Social History Social History   Tobacco Use   Smoking status: Former    Current packs/day: 0.00    Average packs/day: 1 pack/day for 20.0 years (20.0 ttl pk-yrs)    Types: Cigarettes    Start date: 09/07/1987    Quit date: 09/07/2007    Years since quitting: 16.4   Smokeless tobacco: Never  Vaping Use   Vaping status: Never Used  Substance Use Topics   Alcohol use: No    Alcohol/week: 0.0 standard drinks of alcohol   Drug use: No     Allergies   Patient has no known allergies.   Review of Systems Review of Systems   Physical Exam Triage Vital Signs ED Triage Vitals  Encounter Vitals Group     BP 02/17/24 1024 (!) 143/75     Systolic BP Percentile --      Diastolic BP Percentile --      Pulse Rate 02/17/24 1024 73     Resp 02/17/24 1024 16     Temp 02/17/24 1024 97.8 F (36.6 C)     Temp Source 02/17/24 1024 Oral  SpO2 02/17/24 1024 95 %     Weight --      Height --      Head Circumference --      Peak Flow --      Pain Score 02/17/24 1023 8     Pain Loc --      Pain Education --      Exclude from Growth Chart --    No data found.  Updated Vital Signs BP (!) 143/75 (BP Location: Right Arm)   Pulse 73   Temp 97.8 F (36.6 C) (Oral)   Resp 16   SpO2 95%   Visual Acuity Right Eye Distance:   Left Eye Distance:   Bilateral Distance:    Right Eye Near:   Left Eye Near:    Bilateral Near:     Physical Exam Inspection reveals no gross abnormalities of the humeral head.  There is mild tenderness to palpation over the proximal bicipital tendon.  Range of motion is full with flexion, abduction and internal/external rotation.  There is some noted pain with abduction against pain with resistance.  Pain against flexion of the elbow against resistance.  Speeds test positive, Hawkins negative, Neer's  impingement negative.  UC Treatments / Results  Labs (all labs ordered are listed, but only abnormal results are displayed) Labs Reviewed - No data to display  EKG   Radiology No results found.  Procedures Procedures (including critical care time)  Medications Ordered in UC Medications - No data to display  Initial Impression / Assessment and Plan / UC Course  I have reviewed the triage vital signs and the nursing notes.  Pertinent labs & imaging results that were available during my care of the patient were reviewed by me and considered in my medical decision making (see chart for details).     Patient likely presenting with some bicipital tendinitis versus possible rotator cuff pathology.  Given that patient's main goal is to get some short-term improvement at this time, will go ahead and do meloxicam for the next 14 days.  Patient is advised to not take any other NSAIDs at this time.  Patient was advised to ice the area over the next few days as that will help decrease pain.  Patient understanding and agreeable with plan. Final Clinical Impressions(s) / UC Diagnoses   Final diagnoses:  Pain in joint of right shoulder     Discharge Instructions      Please take your meloxicam 1 pill a day with food for the next 14 days.  Please do not take any other ibuprofen  or NSAIDs while you take this.  Please continue to ice the area for 20 minutes at a time at least twice a day.   ED Prescriptions     Medication Sig Dispense Auth. Provider   meloxicam (MOBIC) 15 MG tablet Take 1 tablet daily with food for 14 days. Then take as needed. 14 tablet Guhan Bruington, MD      PDMP not reviewed this encounter.   Jude Norton, MD 02/17/24 [phone]

## 2024-02-17 NOTE — ED Triage Notes (Signed)
 Patient c/o right shoulder injury x 2 days. Patient states he was pulling and pulling on a pressure washer and injured his right shoulder.

## 2024-03-04 DIAGNOSIS — M19011 Primary osteoarthritis, right shoulder: Secondary | ICD-10-CM | POA: Diagnosis not present

## 2024-03-04 DIAGNOSIS — K6289 Other specified diseases of anus and rectum: Secondary | ICD-10-CM | POA: Diagnosis not present

## 2024-03-04 DIAGNOSIS — M25511 Pain in right shoulder: Secondary | ICD-10-CM | POA: Diagnosis not present

## 2024-03-17 DIAGNOSIS — M25511 Pain in right shoulder: Secondary | ICD-10-CM | POA: Diagnosis not present

## 2024-06-17 DIAGNOSIS — Z23 Encounter for immunization: Secondary | ICD-10-CM | POA: Diagnosis not present

## 2024-06-17 DIAGNOSIS — J029 Acute pharyngitis, unspecified: Secondary | ICD-10-CM | POA: Diagnosis not present

## 2024-06-17 DIAGNOSIS — M545 Low back pain, unspecified: Secondary | ICD-10-CM | POA: Diagnosis not present

## 2024-06-17 DIAGNOSIS — M519 Unspecified thoracic, thoracolumbar and lumbosacral intervertebral disc disorder: Secondary | ICD-10-CM | POA: Diagnosis not present

## 2024-07-01 ENCOUNTER — Other Ambulatory Visit: Payer: Self-pay | Admitting: Internal Medicine

## 2024-07-01 DIAGNOSIS — M5116 Intervertebral disc disorders with radiculopathy, lumbar region: Secondary | ICD-10-CM

## 2024-07-09 ENCOUNTER — Ambulatory Visit
Admission: RE | Admit: 2024-07-09 | Discharge: 2024-07-09 | Disposition: A | Discharge status: Home / Self Care | Source: Ambulatory Visit | Attending: Internal Medicine | Admitting: Internal Medicine

## 2024-07-09 DIAGNOSIS — M4727 Other spondylosis with radiculopathy, lumbosacral region: Secondary | ICD-10-CM | POA: Diagnosis not present

## 2024-07-09 DIAGNOSIS — M5116 Intervertebral disc disorders with radiculopathy, lumbar region: Secondary | ICD-10-CM

## 2024-07-09 DIAGNOSIS — M5117 Intervertebral disc disorders with radiculopathy, lumbosacral region: Secondary | ICD-10-CM | POA: Diagnosis not present
